# Patient Record
Sex: Female | Born: 1940 | Race: White | Hispanic: No | Marital: Married | Smoking: Never smoker
Health system: Southern US, Community
[De-identification: ages and names within clinical notes are randomized; demographics above are authoritative.]

## PROBLEM LIST (undated history)

## (undated) DIAGNOSIS — R112 Nausea with vomiting, unspecified: Secondary | ICD-10-CM

## (undated) DIAGNOSIS — I4891 Unspecified atrial fibrillation: Secondary | ICD-10-CM

## (undated) DIAGNOSIS — T4145XA Adverse effect of unspecified anesthetic, initial encounter: Secondary | ICD-10-CM

## (undated) DIAGNOSIS — C801 Malignant (primary) neoplasm, unspecified: Secondary | ICD-10-CM

## (undated) DIAGNOSIS — I10 Essential (primary) hypertension: Secondary | ICD-10-CM

## (undated) DIAGNOSIS — Z86718 Personal history of other venous thrombosis and embolism: Secondary | ICD-10-CM

## (undated) DIAGNOSIS — T8859XA Other complications of anesthesia, initial encounter: Secondary | ICD-10-CM

## (undated) DIAGNOSIS — I890 Lymphedema, not elsewhere classified: Secondary | ICD-10-CM

## (undated) DIAGNOSIS — Z9889 Other specified postprocedural states: Secondary | ICD-10-CM

## (undated) HISTORY — PX: MELANOMA EXCISION: SHX5266

## (undated) HISTORY — DX: Unspecified atrial fibrillation: I48.91

## (undated) HISTORY — PX: TUBAL LIGATION: SHX77

---

## 1990-11-28 DIAGNOSIS — C4372 Malignant melanoma of left lower limb, including hip: Secondary | ICD-10-CM | POA: Insufficient documentation

## 1995-11-28 DIAGNOSIS — C774 Secondary and unspecified malignant neoplasm of inguinal and lower limb lymph nodes: Secondary | ICD-10-CM | POA: Insufficient documentation

## 1997-01-05 DIAGNOSIS — Z86718 Personal history of other venous thrombosis and embolism: Secondary | ICD-10-CM

## 1997-01-05 HISTORY — DX: Personal history of other venous thrombosis and embolism: Z86.718

## 2010-02-03 ENCOUNTER — Ambulatory Visit (HOSPITAL_COMMUNITY)
Admission: RE | Admit: 2010-02-03 | Discharge: 2010-02-03 | Payer: Self-pay | Source: Home / Self Care | Attending: Oncology | Admitting: Oncology

## 2010-02-03 LAB — GLUCOSE, CAPILLARY: Glucose-Capillary: 98 mg/dL (ref 70–99)

## 2011-01-21 DIAGNOSIS — J209 Acute bronchitis, unspecified: Secondary | ICD-10-CM | POA: Diagnosis not present

## 2011-02-06 DIAGNOSIS — Z09 Encounter for follow-up examination after completed treatment for conditions other than malignant neoplasm: Secondary | ICD-10-CM | POA: Diagnosis not present

## 2011-02-06 DIAGNOSIS — Z85820 Personal history of malignant melanoma of skin: Secondary | ICD-10-CM | POA: Diagnosis not present

## 2011-02-06 DIAGNOSIS — Z87898 Personal history of other specified conditions: Secondary | ICD-10-CM | POA: Diagnosis not present

## 2011-03-16 DIAGNOSIS — L820 Inflamed seborrheic keratosis: Secondary | ICD-10-CM | POA: Diagnosis not present

## 2011-03-16 DIAGNOSIS — Z85820 Personal history of malignant melanoma of skin: Secondary | ICD-10-CM | POA: Diagnosis not present

## 2011-05-19 DIAGNOSIS — I10 Essential (primary) hypertension: Secondary | ICD-10-CM | POA: Diagnosis not present

## 2011-05-19 DIAGNOSIS — E782 Mixed hyperlipidemia: Secondary | ICD-10-CM | POA: Diagnosis not present

## 2011-09-16 DIAGNOSIS — I890 Lymphedema, not elsewhere classified: Secondary | ICD-10-CM | POA: Diagnosis not present

## 2011-09-16 DIAGNOSIS — R300 Dysuria: Secondary | ICD-10-CM | POA: Diagnosis not present

## 2011-09-16 DIAGNOSIS — Z85820 Personal history of malignant melanoma of skin: Secondary | ICD-10-CM | POA: Diagnosis not present

## 2011-10-22 DIAGNOSIS — R109 Unspecified abdominal pain: Secondary | ICD-10-CM | POA: Diagnosis not present

## 2011-10-30 DIAGNOSIS — E782 Mixed hyperlipidemia: Secondary | ICD-10-CM | POA: Diagnosis not present

## 2011-10-30 DIAGNOSIS — I10 Essential (primary) hypertension: Secondary | ICD-10-CM | POA: Diagnosis not present

## 2011-11-02 DIAGNOSIS — N949 Unspecified condition associated with female genital organs and menstrual cycle: Secondary | ICD-10-CM | POA: Diagnosis not present

## 2011-11-02 DIAGNOSIS — R109 Unspecified abdominal pain: Secondary | ICD-10-CM | POA: Diagnosis not present

## 2011-12-04 DIAGNOSIS — Z23 Encounter for immunization: Secondary | ICD-10-CM | POA: Diagnosis not present

## 2012-01-24 IMAGING — PT NM PET IMAGE RESTAGE (PS) WHOLE BODY
6 series · 25 of 25 positions shown · non-contrast
Comparison: None

CLINICAL DATA: Subsequent treatment strategy for melanoma.

NUCLEAR MEDICINE WHOLE BODY PET CT
TECHNIQUE: 17.3 mCi F-18 FDG was injected intravenously via the
left antecubital.  Full-ring PET imaging was performed from the
skull vertex through the lower extremities 60 minutes after
injection.  CT data was obtained and used for attenuation
correction and anatomic localization only.  (This was not acquired
as a diagnostic CT examination.)
Fasting Blood Glucose:  98

[Series 1: pet ac · axial · 3.3mm · 4.69mm/px · z∈[-879,-9]mm · 5 of 267 slices shown]
[im 1/267]
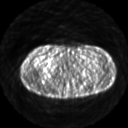
[im 67/267]
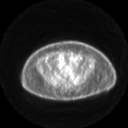
[im 134/267]
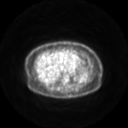
[im 200/267]
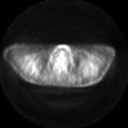
[im 267/267]
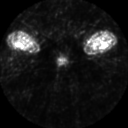

[Series 2: ct images · axial · 3.8mm · 0.98mm/px · z∈[-879,-10]mm · 6 of 266 slices shown]
[im 1/266  soft-tissue]
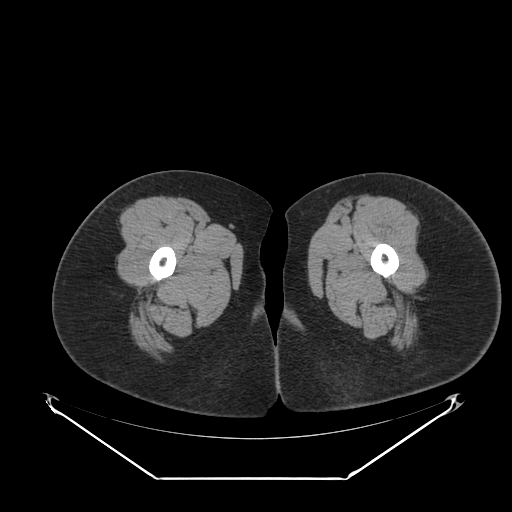
[im 54/266  soft-tissue]
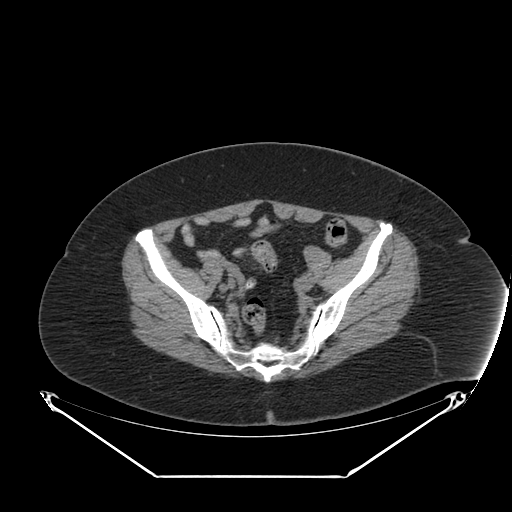
[im 107/266  soft-tissue]
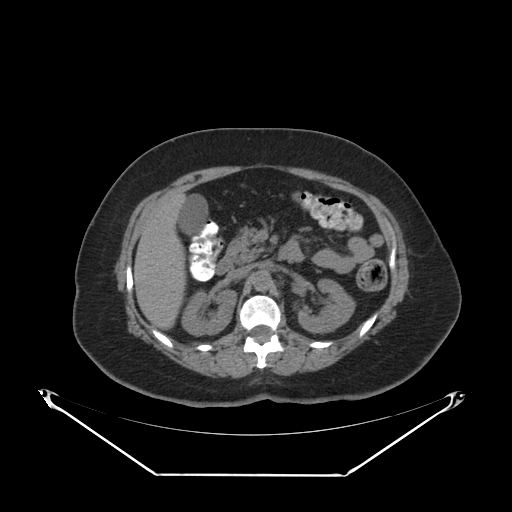
[im 160/266  soft-tissue]
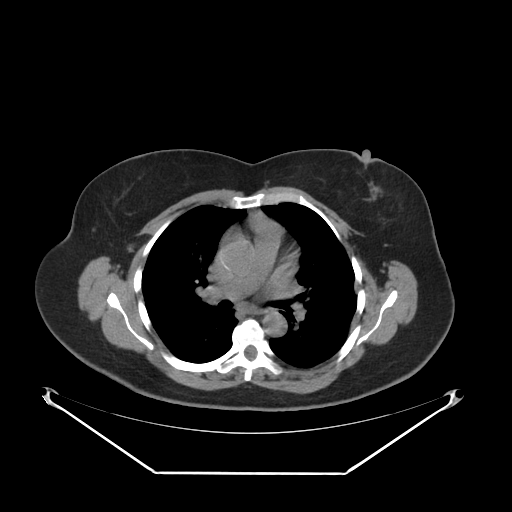
[im 213/266  soft-tissue]
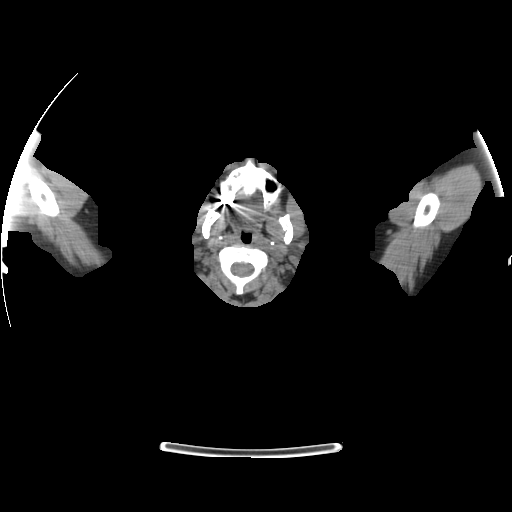
[im 266/266  soft-tissue]
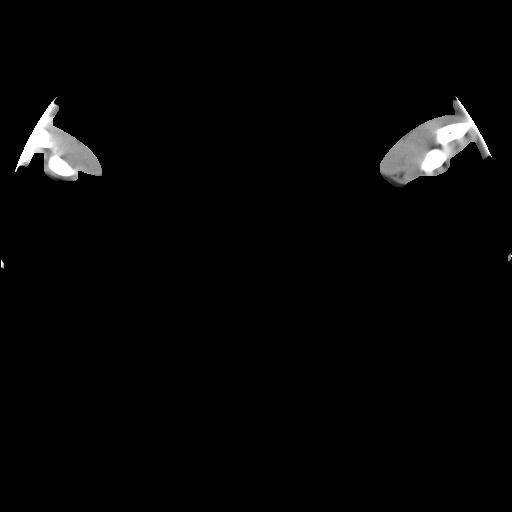

[Series 2: pet nac · axial · 3.3mm · 4.69mm/px · z∈[-879,-9]mm · 6 of 267 slices shown]
[im 1/267]
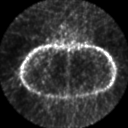
[im 54/267]
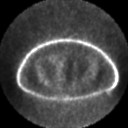
[im 107/267]
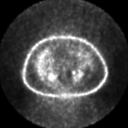
[im 160/267]
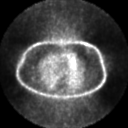
[im 213/267]
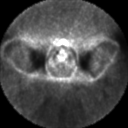
[im 267/267]
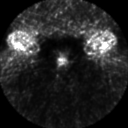

[Series 123: mip · coronal · 3.3mm · 4.69mm/px · 1 of 30 slices shown]
[im 1/30]
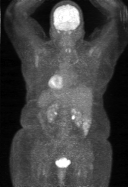

[Series 151: reformatted · axial · 3.3mm · 3.91mm/px · z∈[-879,-9]mm · 6 of 265 slices shown (1 of 2)]
[im 1/265]
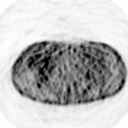
[im 53/265]
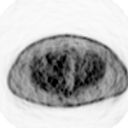
[im 106/265]
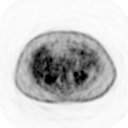
[im 159/265]
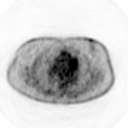
[im 212/265]
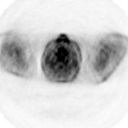
[im 265/265]
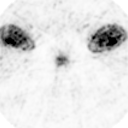

[Series 153: reformatted · coronal · 4.7mm · 6.98mm/px · 1 of 63 slices shown (2 of 2)]
[im 1/63]
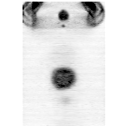

[25 of 25 positions shown; findings below may reference images not displayed]

FINDINGS: No areas of abnormal FDG uptake are identified.  The CT
examination shows no significant abnormalities.  There is a small
calcified splenic artery aneurysm noted.  The uterus and ovaries
appear normal.  No adenopathy.
IMPRESSION: Negative PET CT for metabolically active melanoma.

## 2013-12-21 DIAGNOSIS — M9903 Segmental and somatic dysfunction of lumbar region: Secondary | ICD-10-CM | POA: Diagnosis not present

## 2013-12-21 DIAGNOSIS — M5416 Radiculopathy, lumbar region: Secondary | ICD-10-CM | POA: Diagnosis not present

## 2013-12-21 DIAGNOSIS — M9902 Segmental and somatic dysfunction of thoracic region: Secondary | ICD-10-CM | POA: Diagnosis not present

## 2013-12-21 DIAGNOSIS — M9905 Segmental and somatic dysfunction of pelvic region: Secondary | ICD-10-CM | POA: Diagnosis not present

## 2013-12-26 DIAGNOSIS — M9905 Segmental and somatic dysfunction of pelvic region: Secondary | ICD-10-CM | POA: Diagnosis not present

## 2013-12-26 DIAGNOSIS — M9902 Segmental and somatic dysfunction of thoracic region: Secondary | ICD-10-CM | POA: Diagnosis not present

## 2013-12-26 DIAGNOSIS — M5416 Radiculopathy, lumbar region: Secondary | ICD-10-CM | POA: Diagnosis not present

## 2013-12-26 DIAGNOSIS — M9903 Segmental and somatic dysfunction of lumbar region: Secondary | ICD-10-CM | POA: Diagnosis not present

## 2014-03-19 ENCOUNTER — Encounter (HOSPITAL_COMMUNITY)
Admission: RE | Admit: 2014-03-19 | Discharge: 2014-03-19 | Disposition: A | Discharge status: Home / Self Care | Payer: Medicare Other | Source: Ambulatory Visit | Attending: Obstetrics and Gynecology | Admitting: Obstetrics and Gynecology

## 2014-03-19 ENCOUNTER — Encounter (HOSPITAL_COMMUNITY): Payer: Self-pay

## 2014-03-19 ENCOUNTER — Other Ambulatory Visit: Payer: Self-pay

## 2014-03-19 DIAGNOSIS — Z01810 Encounter for preprocedural cardiovascular examination: Secondary | ICD-10-CM | POA: Diagnosis present

## 2014-03-19 DIAGNOSIS — Z01812 Encounter for preprocedural laboratory examination: Secondary | ICD-10-CM | POA: Insufficient documentation

## 2014-03-19 HISTORY — DX: Lymphedema, not elsewhere classified: I89.0

## 2014-03-19 HISTORY — DX: Personal history of other venous thrombosis and embolism: Z86.718

## 2014-03-19 HISTORY — DX: Other complications of anesthesia, initial encounter: T88.59XA

## 2014-03-19 HISTORY — DX: Adverse effect of unspecified anesthetic, initial encounter: T41.45XA

## 2014-03-19 HISTORY — DX: Essential (primary) hypertension: I10

## 2014-03-19 HISTORY — DX: Malignant (primary) neoplasm, unspecified: C80.1

## 2014-03-19 LAB — CBC
HEMATOCRIT: 41.8 % (ref 36.0–46.0)
HEMOGLOBIN: 14.1 g/dL (ref 12.0–15.0)
MCH: 30.4 pg (ref 26.0–34.0)
MCHC: 33.7 g/dL (ref 30.0–36.0)
MCV: 90.1 fL (ref 78.0–100.0)
Platelets: 275 10*3/uL (ref 150–400)
RBC: 4.64 MIL/uL (ref 3.87–5.11)
RDW: 15.1 % (ref 11.5–15.5)
WBC: 9 10*3/uL (ref 4.0–10.5)

## 2014-03-19 LAB — BASIC METABOLIC PANEL
ANION GAP: 8 (ref 5–15)
BUN: 21 mg/dL (ref 6–23)
CALCIUM: 9.8 mg/dL (ref 8.4–10.5)
CO2: 29 mmol/L (ref 19–32)
CREATININE: 0.79 mg/dL (ref 0.50–1.10)
Chloride: 106 mmol/L (ref 96–112)
GFR calc Af Amer: >90 mL/min (ref >90)
GFR calc non Af Amer: 80 mL/min — ABNORMAL LOW (ref >90)
GLUCOSE: 80 mg/dL (ref 70–99)
Potassium: 4.1 mmol/L (ref 3.5–5.1)
SODIUM: 143 mmol/L (ref 135–145)

## 2014-03-19 NOTE — Anesthesia Preprocedure Evaluation (Addendum)
Anesthesia Evaluation  Patient identified by MRN, date of birth, ID band Patient awake    Reviewed: Allergy & Precautions, H&P , NPO status , Patient's Chart, lab work & pertinent test results  History of Anesthesia Complications (+) history of anesthetic complications (Reports being "slow to wake up" and requiring a "wake up shot" and being held overnight after last anesthetic, records being requested)  Airway Mallampati: II       Dental no notable dental hx.    Pulmonary neg pulmonary ROS,  breath sounds clear to auscultation  Pulmonary exam normal       Cardiovascular Exercise Tolerance: Good hypertension, Pt. on medications Rhythm:regular Rate:Normal     Neuro/Psych negative neurological ROS  negative psych ROS   GI/Hepatic negative GI ROS, Neg liver ROS,   Endo/Other  obesity  Renal/GU negative Renal ROS  negative genitourinary   Musculoskeletal negative musculoskeletal ROS (+)   Abdominal   Peds negative pediatric ROS (+)  Hematology negative hematology ROS (+)   Anesthesia Other Findings   Reproductive/Obstetrics negative OB ROS                            Anesthesia Physical Anesthesia Plan  ASA: II  Anesthesia Plan: Spinal   Post-op Pain Management:    Induction:   Airway Management Planned:   Additional Equipment:   Intra-op Plan:   Post-operative Plan:   Informed Consent: I have reviewed the patients History and Physical, chart, labs and discussed the procedure including the risks, benefits and alternatives for the proposed anesthesia with the patient or authorized representative who has indicated his/her understanding and acceptance.     Plan Discussed with: Anesthesiologist, CRNA and Surgeon  Anesthesia Plan Comments: (Patient seen 3/14 at patient request because she is anxious about anesthesia. Reports being very slow to wake up after last surgery (knee  surgery in [place]) and then she had to get a "wake up shot" and remain in the hospital overnight. I have requested that this anesthetic record be sent for and will need to be reviewed. She reports that even after colonoscopies she is slow to get back to her baseline. I discussed with the patient the wide variety of causes of delayed emergence and discussed that we can give just the minimum amount of medication required for her surgery. Would suggest avoiding benzodiazepines. )       Anesthesia Quick Evaluation

## 2014-03-27 ENCOUNTER — Ambulatory Visit (HOSPITAL_COMMUNITY): Payer: Medicare Other | Admitting: Anesthesiology

## 2014-03-27 ENCOUNTER — Ambulatory Visit (HOSPITAL_COMMUNITY)
Admission: RE | Admit: 2014-03-27 | Discharge: 2014-03-27 | Disposition: A | Discharge status: Home / Self Care | Payer: Medicare Other | Source: Ambulatory Visit | Attending: Obstetrics and Gynecology | Admitting: Obstetrics and Gynecology

## 2014-03-27 ENCOUNTER — Encounter (HOSPITAL_COMMUNITY): Payer: Self-pay | Admitting: Emergency Medicine

## 2014-03-27 ENCOUNTER — Encounter (HOSPITAL_COMMUNITY)
Admission: RE | Disposition: A | Discharge status: Home / Self Care | Payer: Self-pay | Source: Ambulatory Visit | Attending: Obstetrics and Gynecology

## 2014-03-27 DIAGNOSIS — Z9851 Tubal ligation status: Secondary | ICD-10-CM | POA: Diagnosis not present

## 2014-03-27 DIAGNOSIS — N950 Postmenopausal bleeding: Secondary | ICD-10-CM | POA: Insufficient documentation

## 2014-03-27 DIAGNOSIS — Z7982 Long term (current) use of aspirin: Secondary | ICD-10-CM | POA: Diagnosis not present

## 2014-03-27 DIAGNOSIS — N904 Leukoplakia of vulva: Secondary | ICD-10-CM | POA: Insufficient documentation

## 2014-03-27 DIAGNOSIS — N841 Polyp of cervix uteri: Secondary | ICD-10-CM | POA: Insufficient documentation

## 2014-03-27 DIAGNOSIS — I10 Essential (primary) hypertension: Secondary | ICD-10-CM | POA: Insufficient documentation

## 2014-03-27 HISTORY — DX: Nausea with vomiting, unspecified: R11.2

## 2014-03-27 HISTORY — DX: Nausea with vomiting, unspecified: Z98.890

## 2014-03-27 HISTORY — PX: VULVA /PERINEUM BIOPSY: SHX319

## 2014-03-27 HISTORY — PX: HYSTEROSCOPY W/D&C: SHX1775

## 2014-03-27 SURGERY — DILATATION AND CURETTAGE /HYSTEROSCOPY
Anesthesia: Spinal | Site: Vagina

## 2014-03-27 MED ORDER — LIDOCAINE HCL 1 % IJ SOLN
INTRAMUSCULAR | Status: AC
  Filled 2014-03-27: qty 20

## 2014-03-27 MED ORDER — LIDOCAINE HCL (CARDIAC) 20 MG/ML IV SOLN
INTRAVENOUS | Status: AC
  Filled 2014-03-27: qty 5

## 2014-03-27 MED ORDER — IBUPROFEN 600 MG PO TABS: 600.0000 mg | ORAL_TABLET | Freq: Four times a day (QID) | ORAL | Status: DC | PRN

## 2014-03-27 MED ORDER — LACTATED RINGERS IV SOLN
INTRAVENOUS | Status: DC
  Administered 2014-03-27: 12:00:00 via INTRAVENOUS

## 2014-03-27 MED ORDER — PROPOFOL 10 MG/ML IV BOLUS
INTRAVENOUS | Status: AC
  Filled 2014-03-27: qty 20

## 2014-03-27 MED ORDER — FENTANYL CITRATE 0.05 MG/ML IJ SOLN
INTRAMUSCULAR | Status: AC
  Filled 2014-03-27: qty 2

## 2014-03-27 MED ORDER — BUPIVACAINE IN DEXTROSE 0.75-8.25 % IT SOLN
INTRATHECAL | Status: DC | PRN
  Administered 2014-03-27: 1.2 mL via INTRATHECAL

## 2014-03-27 MED ORDER — SODIUM CHLORIDE 0.9 % IR SOLN
Status: DC | PRN
  Administered 2014-03-27: 3000 mL

## 2014-03-27 MED ORDER — ONDANSETRON HCL 4 MG/2ML IJ SOLN
INTRAMUSCULAR | Status: AC
  Filled 2014-03-27: qty 2

## 2014-03-27 MED ORDER — DEXAMETHASONE SODIUM PHOSPHATE 4 MG/ML IJ SOLN
INTRAMUSCULAR | Status: AC
  Filled 2014-03-27: qty 1

## 2014-03-27 MED ORDER — LIDOCAINE HCL 1 % IJ SOLN
INTRAMUSCULAR | Status: DC | PRN
  Administered 2014-03-27: 6 mL

## 2014-03-27 SURGICAL SUPPLY — 28 items
ABLATOR ENDOMETRIAL BIPOLAR (ABLATOR) IMPLANT
APPLICATOR COTTON TIP 6IN STRL (MISCELLANEOUS) IMPLANT
BLADE 15 SAFETY STRL DISP (BLADE) ×3 IMPLANT
CANISTER SUCT 3000ML (MISCELLANEOUS) ×3 IMPLANT
CATH ROBINSON RED A/P 16FR (CATHETERS) ×3 IMPLANT
CATH THERMACHOICE III (CATHETERS) IMPLANT
CLOTH BEACON ORANGE TIMEOUT ST (SAFETY) ×3 IMPLANT
CONTAINER PREFILL 10% NBF 60ML (FORM) ×9 IMPLANT
ELECT REM PT RETURN 9FT ADLT (ELECTROSURGICAL) ×3
ELECTRODE REM PT RTRN 9FT ADLT (ELECTROSURGICAL) ×1 IMPLANT
GLOVE BIO SURGEON STRL SZ 6.5 (GLOVE) ×2 IMPLANT
GLOVE BIO SURGEONS STRL SZ 6.5 (GLOVE) ×1
GLOVE BIOGEL PI IND STRL 7.0 (GLOVE) ×1 IMPLANT
GLOVE BIOGEL PI INDICATOR 7.0 (GLOVE) ×2
GOWN STRL REUS W/TWL LRG LVL3 (GOWN DISPOSABLE) ×6 IMPLANT
LOOP ANGLED CUTTING 22FR (CUTTING LOOP) IMPLANT
NS IRRIG 1000ML POUR BTL (IV SOLUTION) ×3 IMPLANT
PACK VAGINAL MINOR WOMEN LF (CUSTOM PROCEDURE TRAY) ×3 IMPLANT
PAD OB MATERNITY 4.3X12.25 (PERSONAL CARE ITEMS) ×3 IMPLANT
PENCIL BUTTON HOLSTER BLD 10FT (ELECTRODE) ×3 IMPLANT
SCOPETTES 8  STERILE (MISCELLANEOUS)
SCOPETTES 8 STERILE (MISCELLANEOUS) IMPLANT
SUT VIC AB 3-0 SH 27 (SUTURE)
SUT VIC AB 3-0 SH 27X BRD (SUTURE) IMPLANT
TOWEL OR 17X24 6PK STRL BLUE (TOWEL DISPOSABLE) ×6 IMPLANT
TUBING AQUILEX INFLOW (TUBING) ×3 IMPLANT
TUBING AQUILEX OUTFLOW (TUBING) ×3 IMPLANT
WATER STERILE IRR 1000ML POUR (IV SOLUTION) ×3 IMPLANT

## 2014-03-27 NOTE — Anesthesia Procedure Notes (Signed)
Spinal Patient location during procedure: OR Start time: 03/27/2014 1:05 PM Staffing Anesthesiologist: Rudean Curt Performed by: anesthesiologist  Preanesthetic Checklist Completed: patient identified, site marked, surgical consent, pre-op evaluation, timeout performed, IV checked, risks and benefits discussed and monitors and equipment checked Spinal Block Patient position: sitting Prep: DuraPrep Patient monitoring: heart rate, cardiac monitor, continuous pulse ox and blood pressure Approach: midline Location: L3-4 Injection technique: single-shot Needle Needle type: Sprotte  Needle gauge: 24 G Needle length: 9 cm Assessment Sensory level: T4 Additional Notes Patient identified.  Risk benefits discussed including failed block, incomplete pain control, headache, nerve damage, paralysis, blood pressure changes, nausea, vomiting, reactions to medication both toxic or allergic, and postpartum back pain.  Patient expressed understanding and wished to proceed.  All questions were answered.  Sterile technique used throughout procedure.  CSF was clear.  No parasthesia or other complications.  Please see nursing notes for vital signs.

## 2014-03-27 NOTE — H&P (Signed)
Latasha Wells is an 74 y.o. female with PMB and persistant vulvar itching presents for surgical evaluation and treatment.    Past Medical History  Diagnosis Date  . Hypertension   . Cancer     melanoma of lymph system  . Lymphedema of leg     left leg  . H/O blood clots 1999    left leg behind knee  . Complication of anesthesia     "hard to wake up", reaction to med ('wake up shot")    Past Surgical History  Procedure Laterality Date  . Tubal ligation    . Melanoma excision      x 2    No family history on file.  Social History:  reports that she has never smoked. She does not have any smokeless tobacco history on file. She reports that she does not drink alcohol or use illicit drugs.  Allergies:  Allergies  Allergen Reactions  . Codeine Nausea And Vomiting  . Hydrocodone Other (See Comments)    Seeing double  . Sulfa Antibiotics Other (See Comments)    Family history, prefers not to take    No prescriptions prior to admission    ROS  AF, VSS  Physical Exam  Gen - NAD CV - RRR Lungs  Clear Abd - soft, NT PV - uterus mobile, NT.  No adnexal masses No vulvar masses, ulcerations or pigmented lesions.  mild hypopigmentation of labia and clitoral hood  Assessment/Plan: PMB, vulvar itching Hysteroscopy, D&C, vulvar biopsy  Jenean Escandon 03/27/2014, 8:36 AM

## 2014-03-27 NOTE — Discharge Instructions (Signed)
DISCHARGE INSTRUCTIONS: D&C  °The following instructions have been prepared to help you care for yourself upon your return home. °  °Personal hygiene: °• Use sanitary pads for vaginal drainage, not tampons. °• Shower the day after your procedure. °• NO tub baths, pools or Jacuzzis for 2-3 weeks. °• Wipe front to back after using the bathroom. ° °Activity and limitations: °• Do NOT drive or operate any equipment for 24 hours. The effects of anesthesia are still present and drowsiness may result. °• Do NOT rest in bed all day. °• Walking is encouraged. °• Walk up and down stairs slowly. °• You may resume your normal activity in one to two days or as indicated by your physician. ° °Sexual activity: NO intercourse for at least 2 weeks after the procedure, or as indicated by your physician. ° °Diet: Eat a light meal as desired this evening. You may resume your usual diet tomorrow. ° °Return to work: You may resume your work activities in one to two days or as indicated by your doctor. ° °What to expect after your surgery: Expect to have vaginal bleeding/discharge for 2-3 days and spotting for up to 10 days. It is not unusual to have soreness for up to 1-2 weeks. You may have a slight burning sensation when you urinate for the first day. Mild cramps may continue for a couple of days. You may have a regular period in 2-6 weeks. ° °Call your doctor for any of the following: °• Excessive vaginal bleeding, saturating and changing one pad every hour. °• Inability to urinate 6 hours after discharge from hospital. °• Pain not relieved by pain medication. °• Fever of 100.4° F or greater. °• Unusual vaginal discharge or odor. ° ° Call for an appointment:  ° ° °Patient’s signature: ______________________ ° °Nurse’s signature ________________________ ° °Support person's signature_______________________ ° ° ° °

## 2014-03-27 NOTE — Anesthesia Postprocedure Evaluation (Signed)
Anesthesia Post Note  Patient: Latasha Wells  Procedure(s) Performed: Procedure(s) (LRB): DILATATION AND CURETTAGE /HYSTEROSCOPY, POLYPECTOMY (N/A) LEFT VULVAR BIOPSY (N/A)  Anesthesia type: Spinal  Patient location: PACU  Post pain: Pain level controlled  Post assessment: Post-op Vital signs reviewed  Last Vitals: There were no vitals filed for this visit.  Post vital signs: Reviewed  Level of consciousness: awake  Complications: No apparent anesthesia complications

## 2014-03-27 NOTE — Transfer of Care (Signed)
Immediate Anesthesia Transfer of Care Note  Patient: Latasha Wells  Procedure(s) Performed: Procedure(s): DILATATION AND CURETTAGE /HYSTEROSCOPY (N/A) VULVAR BIOPSY (N/A)  Patient Location: PACU  Anesthesia Type:Spinal  Level of Consciousness: awake, alert , oriented and patient cooperative  Airway & Oxygen Therapy: Patient Spontanous Breathing  Post-op Assessment: Report given to RN and Post -op Vital signs reviewed and stable  Post vital signs: Reviewed and stable  Last Vitals: There were no vitals filed for this visit.  Complications: No apparent anesthesia complications

## 2014-03-28 NOTE — Op Note (Signed)
NAME:  Latasha Wells, SOLEY NO.:  [id number]  MEDICAL RECORD NO.:  [id number]  LOCATION:  [facility]                          FACILITY:  [facility]  PHYSICIAN:  Marylynn Pearson, MD    DATE OF BIRTH:  08-20-1940  DATE OF PROCEDURE:  03/27/2014 DATE OF DISCHARGE:  03/27/2014                              OPERATIVE REPORT   PREOPERATIVE DIAGNOSES: 1. Postmenopausal bleeding. 2. Persistent vulvar itching.  PROCEDURES: 1. Paracervical block. 2. Hysteroscopy D and C. 3. Polypectomy. 4. Vulvar biopsy.  SURGEON:  Marylynn Pearson, MD  ANESTHESIA:  Spinal.  FLUID DEFICIT:  50 mL.  ESTIMATED BLOOD LOSS:  Minimal.  COMPLICATIONS:  None.  CONDITION:  Stable to recovery room.  DESCRIPTION OF PROCEDURE:  The patient was taken to the operating room. After informed consent was obtained, she was given spinal anesthesia. Placed in dorsal lithotomy position with Allen stirrups.  Prepped and draped in sterile fashion.  In-and-out catheter was used to drain her bladder for an unmeasured amount of clear urine.  Bivalve speculum was placed in the vagina and 1% lidocaine was injected at the 12 o'clock position of the cervix.  A single-tooth tenaculum was attached and a paracervical block was performed using 8 mL of 1% lidocaine.  The cervix was then serially dilated using Pratt dilators and a hysteroscope was inserted.  She was noted to have what appeared to be a polyp at the internal cervical os.  No other abnormalities were noted throughout the endometrial cavity.  Hysteroscope was removed and polyp forceps were introduced.  Polyp was removed without difficulty, placed on Telfa, and passed off to be sent to Pathology.  A gentle curetting was then performed throughout the cavity and placed on Telfa.  All instruments were then removed from the vagina.  Lidocaine 1% was injected at the site of our vulvar biopsy.  A 5 mm punch was used.  Tissue was excised using Metzenbaum scissors, placed  on Telfa, and passed off to be sent to Pathology.  Hemostasis was achieved using silver nitrate.  She tolerated the procedure well.  Sponge, lap, needle, and instrument counts were correct x2 at the end of the procedure.  She was taken to the recovery room in stable condition.     Marylynn Pearson, MD     GA/MEDQ  D:  03/27/2014  T:  03/28/2014  Job:  [id number]

## 2014-03-30 ENCOUNTER — Encounter (HOSPITAL_COMMUNITY): Payer: Self-pay | Admitting: Obstetrics and Gynecology

## 2014-12-07 DIAGNOSIS — Z85820 Personal history of malignant melanoma of skin: Secondary | ICD-10-CM | POA: Diagnosis not present

## 2016-01-13 DIAGNOSIS — I10 Essential (primary) hypertension: Secondary | ICD-10-CM | POA: Diagnosis not present

## 2016-01-28 DIAGNOSIS — R21 Rash and other nonspecific skin eruption: Secondary | ICD-10-CM | POA: Diagnosis not present

## 2016-01-29 DIAGNOSIS — Z85820 Personal history of malignant melanoma of skin: Secondary | ICD-10-CM | POA: Diagnosis not present

## 2016-01-29 DIAGNOSIS — C4372 Malignant melanoma of left lower limb, including hip: Secondary | ICD-10-CM | POA: Diagnosis not present

## 2016-01-29 DIAGNOSIS — I890 Lymphedema, not elsewhere classified: Secondary | ICD-10-CM | POA: Diagnosis not present

## 2016-01-29 DIAGNOSIS — B372 Candidiasis of skin and nail: Secondary | ICD-10-CM | POA: Diagnosis not present

## 2016-01-29 DIAGNOSIS — C774 Secondary and unspecified malignant neoplasm of inguinal and lower limb lymph nodes: Secondary | ICD-10-CM | POA: Diagnosis not present

## 2016-02-25 DIAGNOSIS — Z1231 Encounter for screening mammogram for malignant neoplasm of breast: Secondary | ICD-10-CM | POA: Diagnosis not present

## 2016-04-27 DIAGNOSIS — E669 Obesity, unspecified: Secondary | ICD-10-CM | POA: Diagnosis not present

## 2016-04-27 DIAGNOSIS — I10 Essential (primary) hypertension: Secondary | ICD-10-CM | POA: Diagnosis not present

## 2016-04-27 DIAGNOSIS — Z13820 Encounter for screening for osteoporosis: Secondary | ICD-10-CM | POA: Diagnosis not present

## 2016-04-27 DIAGNOSIS — Z6838 Body mass index (BMI) 38.0-38.9, adult: Secondary | ICD-10-CM | POA: Diagnosis not present

## 2016-04-27 DIAGNOSIS — Z0000 Encounter for general adult medical examination without abnormal findings: Secondary | ICD-10-CM | POA: Diagnosis not present

## 2016-05-04 DIAGNOSIS — J029 Acute pharyngitis, unspecified: Secondary | ICD-10-CM | POA: Diagnosis not present

## 2016-05-12 DIAGNOSIS — J209 Acute bronchitis, unspecified: Secondary | ICD-10-CM | POA: Diagnosis not present

## 2016-07-13 DIAGNOSIS — Z08 Encounter for follow-up examination after completed treatment for malignant neoplasm: Secondary | ICD-10-CM | POA: Diagnosis not present

## 2016-07-13 DIAGNOSIS — Z85820 Personal history of malignant melanoma of skin: Secondary | ICD-10-CM | POA: Diagnosis not present

## 2016-07-27 DIAGNOSIS — I10 Essential (primary) hypertension: Secondary | ICD-10-CM | POA: Diagnosis not present

## 2016-08-04 DIAGNOSIS — Z0001 Encounter for general adult medical examination with abnormal findings: Secondary | ICD-10-CM | POA: Diagnosis not present

## 2016-08-04 DIAGNOSIS — Z85820 Personal history of malignant melanoma of skin: Secondary | ICD-10-CM | POA: Diagnosis not present

## 2016-12-03 DIAGNOSIS — I10 Essential (primary) hypertension: Secondary | ICD-10-CM | POA: Diagnosis not present

## 2016-12-03 DIAGNOSIS — E669 Obesity, unspecified: Secondary | ICD-10-CM | POA: Diagnosis not present

## 2016-12-03 DIAGNOSIS — Z6837 Body mass index (BMI) 37.0-37.9, adult: Secondary | ICD-10-CM | POA: Diagnosis not present

## 2016-12-07 DIAGNOSIS — J209 Acute bronchitis, unspecified: Secondary | ICD-10-CM | POA: Diagnosis not present

## 2017-01-17 DIAGNOSIS — R112 Nausea with vomiting, unspecified: Secondary | ICD-10-CM | POA: Diagnosis not present

## 2017-01-17 DIAGNOSIS — N3000 Acute cystitis without hematuria: Secondary | ICD-10-CM | POA: Diagnosis not present

## 2017-01-17 DIAGNOSIS — J069 Acute upper respiratory infection, unspecified: Secondary | ICD-10-CM | POA: Diagnosis not present

## 2017-01-17 DIAGNOSIS — K5900 Constipation, unspecified: Secondary | ICD-10-CM | POA: Diagnosis not present

## 2017-01-17 DIAGNOSIS — J9811 Atelectasis: Secondary | ICD-10-CM | POA: Diagnosis not present

## 2017-01-27 DIAGNOSIS — N398 Other specified disorders of urinary system: Secondary | ICD-10-CM | POA: Diagnosis not present

## 2017-01-27 DIAGNOSIS — J069 Acute upper respiratory infection, unspecified: Secondary | ICD-10-CM | POA: Diagnosis not present

## 2017-02-04 DIAGNOSIS — C4372 Malignant melanoma of left lower limb, including hip: Secondary | ICD-10-CM | POA: Diagnosis not present

## 2017-02-04 DIAGNOSIS — I10 Essential (primary) hypertension: Secondary | ICD-10-CM | POA: Diagnosis not present

## 2017-02-04 DIAGNOSIS — D649 Anemia, unspecified: Secondary | ICD-10-CM | POA: Diagnosis not present

## 2017-02-04 DIAGNOSIS — R918 Other nonspecific abnormal finding of lung field: Secondary | ICD-10-CM | POA: Diagnosis not present

## 2017-02-04 DIAGNOSIS — Z85820 Personal history of malignant melanoma of skin: Secondary | ICD-10-CM | POA: Diagnosis not present

## 2017-02-04 DIAGNOSIS — R001 Bradycardia, unspecified: Secondary | ICD-10-CM | POA: Diagnosis not present

## 2017-02-20 DIAGNOSIS — J209 Acute bronchitis, unspecified: Secondary | ICD-10-CM | POA: Diagnosis not present

## 2017-03-30 DIAGNOSIS — Z1231 Encounter for screening mammogram for malignant neoplasm of breast: Secondary | ICD-10-CM | POA: Diagnosis not present

## 2017-04-02 DIAGNOSIS — L570 Actinic keratosis: Secondary | ICD-10-CM | POA: Diagnosis not present

## 2017-04-02 DIAGNOSIS — D485 Neoplasm of uncertain behavior of skin: Secondary | ICD-10-CM | POA: Diagnosis not present

## 2017-04-02 DIAGNOSIS — Z08 Encounter for follow-up examination after completed treatment for malignant neoplasm: Secondary | ICD-10-CM | POA: Diagnosis not present

## 2017-04-02 DIAGNOSIS — Z85820 Personal history of malignant melanoma of skin: Secondary | ICD-10-CM | POA: Diagnosis not present

## 2017-04-02 DIAGNOSIS — L304 Erythema intertrigo: Secondary | ICD-10-CM | POA: Diagnosis not present

## 2017-04-06 DIAGNOSIS — S9031XA Contusion of right foot, initial encounter: Secondary | ICD-10-CM | POA: Diagnosis not present

## 2017-04-20 DIAGNOSIS — M79605 Pain in left leg: Secondary | ICD-10-CM | POA: Diagnosis not present

## 2017-04-21 DIAGNOSIS — M79605 Pain in left leg: Secondary | ICD-10-CM | POA: Diagnosis not present

## 2017-04-21 DIAGNOSIS — M25562 Pain in left knee: Secondary | ICD-10-CM | POA: Diagnosis not present

## 2017-05-06 DIAGNOSIS — I10 Essential (primary) hypertension: Secondary | ICD-10-CM | POA: Diagnosis not present

## 2017-05-06 DIAGNOSIS — E669 Obesity, unspecified: Secondary | ICD-10-CM | POA: Diagnosis not present

## 2017-05-06 DIAGNOSIS — Z0000 Encounter for general adult medical examination without abnormal findings: Secondary | ICD-10-CM | POA: Diagnosis not present

## 2017-05-06 DIAGNOSIS — Z6837 Body mass index (BMI) 37.0-37.9, adult: Secondary | ICD-10-CM | POA: Diagnosis not present

## 2017-05-14 DIAGNOSIS — M1711 Unilateral primary osteoarthritis, right knee: Secondary | ICD-10-CM | POA: Diagnosis not present

## 2017-05-14 DIAGNOSIS — M1712 Unilateral primary osteoarthritis, left knee: Secondary | ICD-10-CM | POA: Diagnosis not present

## 2017-05-14 DIAGNOSIS — M25561 Pain in right knee: Secondary | ICD-10-CM | POA: Diagnosis not present

## 2017-05-14 DIAGNOSIS — M25562 Pain in left knee: Secondary | ICD-10-CM | POA: Diagnosis not present

## 2017-07-09 DIAGNOSIS — M25561 Pain in right knee: Secondary | ICD-10-CM | POA: Insufficient documentation

## 2017-07-09 DIAGNOSIS — M1712 Unilateral primary osteoarthritis, left knee: Secondary | ICD-10-CM | POA: Diagnosis not present

## 2017-07-09 DIAGNOSIS — M1711 Unilateral primary osteoarthritis, right knee: Secondary | ICD-10-CM | POA: Diagnosis not present

## 2017-07-09 DIAGNOSIS — M170 Bilateral primary osteoarthritis of knee: Secondary | ICD-10-CM | POA: Diagnosis not present

## 2017-08-20 DIAGNOSIS — I890 Lymphedema, not elsewhere classified: Secondary | ICD-10-CM | POA: Diagnosis not present

## 2017-08-20 DIAGNOSIS — Z85820 Personal history of malignant melanoma of skin: Secondary | ICD-10-CM | POA: Diagnosis not present

## 2017-11-16 DIAGNOSIS — I10 Essential (primary) hypertension: Secondary | ICD-10-CM | POA: Diagnosis not present

## 2017-11-16 DIAGNOSIS — E669 Obesity, unspecified: Secondary | ICD-10-CM | POA: Diagnosis not present

## 2017-11-16 DIAGNOSIS — Z6838 Body mass index (BMI) 38.0-38.9, adult: Secondary | ICD-10-CM | POA: Diagnosis not present

## 2018-01-07 DIAGNOSIS — M65341 Trigger finger, right ring finger: Secondary | ICD-10-CM | POA: Diagnosis not present

## 2018-02-04 DIAGNOSIS — M65341 Trigger finger, right ring finger: Secondary | ICD-10-CM | POA: Diagnosis not present

## 2018-08-05 ENCOUNTER — Other Ambulatory Visit: Payer: Self-pay

## 2018-08-15 DIAGNOSIS — Z85820 Personal history of malignant melanoma of skin: Secondary | ICD-10-CM | POA: Diagnosis not present

## 2018-08-15 DIAGNOSIS — Z808 Family history of malignant neoplasm of other organs or systems: Secondary | ICD-10-CM | POA: Diagnosis not present

## 2018-08-15 DIAGNOSIS — D485 Neoplasm of uncertain behavior of skin: Secondary | ICD-10-CM | POA: Diagnosis not present

## 2018-08-15 DIAGNOSIS — Z08 Encounter for follow-up examination after completed treatment for malignant neoplasm: Secondary | ICD-10-CM | POA: Diagnosis not present

## 2018-08-15 DIAGNOSIS — Z85828 Personal history of other malignant neoplasm of skin: Secondary | ICD-10-CM | POA: Diagnosis not present

## 2018-08-23 DIAGNOSIS — B079 Viral wart, unspecified: Secondary | ICD-10-CM | POA: Diagnosis not present

## 2018-10-04 DIAGNOSIS — I10 Essential (primary) hypertension: Secondary | ICD-10-CM | POA: Diagnosis not present

## 2018-10-13 DIAGNOSIS — Z23 Encounter for immunization: Secondary | ICD-10-CM | POA: Diagnosis not present

## 2018-10-21 DIAGNOSIS — C4372 Malignant melanoma of left lower limb, including hip: Secondary | ICD-10-CM | POA: Diagnosis not present

## 2018-10-21 DIAGNOSIS — Z85820 Personal history of malignant melanoma of skin: Secondary | ICD-10-CM | POA: Diagnosis not present

## 2018-10-21 DIAGNOSIS — Z1231 Encounter for screening mammogram for malignant neoplasm of breast: Secondary | ICD-10-CM | POA: Diagnosis not present

## 2018-10-26 DIAGNOSIS — Z85820 Personal history of malignant melanoma of skin: Secondary | ICD-10-CM | POA: Diagnosis not present

## 2018-10-26 DIAGNOSIS — E785 Hyperlipidemia, unspecified: Secondary | ICD-10-CM | POA: Diagnosis not present

## 2018-11-10 DIAGNOSIS — Z0000 Encounter for general adult medical examination without abnormal findings: Secondary | ICD-10-CM | POA: Diagnosis not present

## 2018-11-10 DIAGNOSIS — I10 Essential (primary) hypertension: Secondary | ICD-10-CM | POA: Diagnosis not present

## 2018-11-10 DIAGNOSIS — Z6837 Body mass index (BMI) 37.0-37.9, adult: Secondary | ICD-10-CM | POA: Diagnosis not present

## 2018-11-10 DIAGNOSIS — E669 Obesity, unspecified: Secondary | ICD-10-CM | POA: Diagnosis not present

## 2018-11-17 DIAGNOSIS — K828 Other specified diseases of gallbladder: Secondary | ICD-10-CM | POA: Diagnosis not present

## 2018-11-17 DIAGNOSIS — K7689 Other specified diseases of liver: Secondary | ICD-10-CM | POA: Diagnosis not present

## 2018-11-17 DIAGNOSIS — R10811 Right upper quadrant abdominal tenderness: Secondary | ICD-10-CM | POA: Diagnosis not present

## 2019-03-09 DIAGNOSIS — I10 Essential (primary) hypertension: Secondary | ICD-10-CM | POA: Diagnosis not present

## 2019-04-26 DIAGNOSIS — G620 Drug-induced polyneuropathy: Secondary | ICD-10-CM | POA: Diagnosis not present

## 2019-04-26 DIAGNOSIS — Z85820 Personal history of malignant melanoma of skin: Secondary | ICD-10-CM | POA: Diagnosis not present

## 2019-04-26 DIAGNOSIS — C4372 Malignant melanoma of left lower limb, including hip: Secondary | ICD-10-CM | POA: Diagnosis not present

## 2019-08-11 DIAGNOSIS — I10 Essential (primary) hypertension: Secondary | ICD-10-CM | POA: Diagnosis not present

## 2019-08-11 DIAGNOSIS — E669 Obesity, unspecified: Secondary | ICD-10-CM | POA: Diagnosis not present

## 2019-08-11 DIAGNOSIS — Z6838 Body mass index (BMI) 38.0-38.9, adult: Secondary | ICD-10-CM | POA: Diagnosis not present

## 2019-08-21 DIAGNOSIS — D239 Other benign neoplasm of skin, unspecified: Secondary | ICD-10-CM | POA: Diagnosis not present

## 2019-08-21 DIAGNOSIS — Z85820 Personal history of malignant melanoma of skin: Secondary | ICD-10-CM | POA: Diagnosis not present

## 2019-08-21 DIAGNOSIS — D1801 Hemangioma of skin and subcutaneous tissue: Secondary | ICD-10-CM | POA: Diagnosis not present

## 2019-08-21 DIAGNOSIS — L821 Other seborrheic keratosis: Secondary | ICD-10-CM | POA: Diagnosis not present

## 2019-10-05 DIAGNOSIS — L820 Inflamed seborrheic keratosis: Secondary | ICD-10-CM | POA: Diagnosis not present

## 2019-10-05 DIAGNOSIS — D485 Neoplasm of uncertain behavior of skin: Secondary | ICD-10-CM | POA: Diagnosis not present

## 2019-11-16 DIAGNOSIS — Z6838 Body mass index (BMI) 38.0-38.9, adult: Secondary | ICD-10-CM | POA: Diagnosis not present

## 2019-11-16 DIAGNOSIS — Z0000 Encounter for general adult medical examination without abnormal findings: Secondary | ICD-10-CM | POA: Diagnosis not present

## 2019-11-16 DIAGNOSIS — I10 Essential (primary) hypertension: Secondary | ICD-10-CM | POA: Diagnosis not present

## 2019-11-16 DIAGNOSIS — N959 Unspecified menopausal and perimenopausal disorder: Secondary | ICD-10-CM | POA: Diagnosis not present

## 2019-11-21 DIAGNOSIS — Z1231 Encounter for screening mammogram for malignant neoplasm of breast: Secondary | ICD-10-CM | POA: Diagnosis not present

## 2019-11-24 NOTE — Progress Notes (Signed)
[facility]  [address] [place],  [place]  [address] [phone]  Clinic Day:  11/28/2019  Referring physician: Greig Right, MD   This document serves as a record of services personally performed by Hosie Poisson, MD. It was created on their behalf by [facility] E, a trained medical scribe. The creation of this record is based on the scribe's personal observations and the provider's statements to them.   CHIEF COMPLAINT:  CC: History of stage IIA malignant melanoma  Current Treatment:  Surveillance   HISTORY OF PRESENT ILLNESS:  Latasha Wells is a 79 y.o. female with a history of stage IIA malignant melanoma originally diagnosed in 25.  She had a Clark's level II with a Breslow thickness of 7.7 mm, treated with wide excision.  In 1997, she had a recurrence in the left groin and underwent node dissection, followed by 1 year of alpha interferon with Consuello Masse, MD at [facility].  In 1998, when she had just 1 week remaining on the interferon therapy, she developed a recurrence in the left groin again.  She then saw Willadean Carol, MD in [place] and underwent a deep node dissection of the left groin, followed by experimental immune therapy.  She has not had any evidence of recurrence since that time.  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  She sees her dermatologist, Dr. Altamese Cabal, in [facility], regularly.  She states she is up-to-date on screening colonoscopy.  Ultrasound of the abdomen limited right upper quadrant from November 2020 was clear other than suggestion of mild hepatic steatosis.     INTERVAL HISTORY:  Any is here for routine follow up and states that she has been well other than persistent intermittent cough.  This is sometimes productive with white sputum.  Annual bilateral mammogram from November was clear.  Bone density from November 22nd revealed a completely normal  exam.   I have scheduled her for routine screening chest x-ray.  Blood counts and chemistries are unremarkable.  Lipid panel revealed an elevated triglyceride of 154 and an LDL of 113.2, otherwise normal.   Her  appetite is good, and she has gained 4 pounds since her last visit.  She denies fever, chills or other signs of infection.  She denies nausea, vomiting, bowel issues, or abdominal pain.  She denies sore throat, cough, dyspnea, or chest pain.   REVIEW OF SYSTEMS:  Review of Systems  Respiratory: Positive for cough (intermittent but persistent, sometimes productive with white sputum).   All other systems reviewed and are negative.    VITALS:  Blood pressure 139/81, pulse 65, temperature (!) 97.5 F (36.4 C), temperature source Oral, resp. rate 18, height 5\' 1"  (1.549 m), weight 197 lb (89.4 kg), SpO2 97 %.  Wt Readings from Last 3 Encounters:  11/28/19 197 lb (89.4 kg)  03/19/14 193 lb (87.5 kg)    Body mass index is 37.22 kg/m.  Performance status (ECOG): 1 - Symptomatic but completely ambulatory  PHYSICAL EXAM:  Physical Exam Constitutional:      General: She is not in acute distress.    Appearance: Normal appearance. She is normal weight.  HENT:     Head: Normocephalic and atraumatic.  Eyes:     General: No scleral icterus.    Extraocular Movements: Extraocular movements intact.     Conjunctiva/sclera: Conjunctivae normal.     Pupils: Pupils are equal, round, and reactive to light.  Cardiovascular:  Rate and Rhythm: Normal rate and regular rhythm.     Pulses: Normal pulses.     Heart sounds: Normal heart sounds. No murmur heard.  No friction rub. No gallop.   Pulmonary:     Effort: Pulmonary effort is normal. No respiratory distress.     Breath sounds: Normal breath sounds.  Abdominal:     General: Bowel sounds are normal. There is no distension.     Palpations: Abdomen is soft. There is no mass.     Tenderness: There is no abdominal tenderness.   Musculoskeletal:        General: Normal range of motion.     Cervical back: Normal range of motion and neck supple.     Left lower leg: Edema (mild) present.  Lymphadenopathy:     Cervical: No cervical adenopathy.  Skin:    General: Skin is warm and dry.  Neurological:     General: No focal deficit present.     Mental Status: She is alert and oriented to person, place, and time. Mental status is at baseline.  Psychiatric:        Mood and Affect: Mood normal.        Behavior: Behavior normal.        Thought Content: Thought content normal.        Judgment: Judgment normal.     LABS:   CBC Latest Ref Rng & Units 03/19/2014  WBC 4.0 - 10.5 K/uL 9.0  Hemoglobin 12.0 - 15.0 g/dL 14.1  Hematocrit 36 - 46 % 41.8  Platelets 150 - 400 K/uL 275   CMP Latest Ref Rng & Units 03/19/2014  Glucose 70 - 99 mg/dL 80  BUN 6 - 23 mg/dL 21  Creatinine 0.50 - 1.10 mg/dL 0.79  Sodium 135 - 145 mmol/L 143  Potassium 3.5 - 5.1 mmol/L 4.1  Chloride 96 - 112 mmol/L 106  CO2 19 - 32 mmol/L 29  Calcium 8.4 - 10.5 mg/dL 9.8     STUDIES:   She underwent digital screening bilateral mammogram with tomography on 11/21/2019 showing: breast density category B.  No evidence of malignancy.  She underwent DXA for bone mineral density on 11/27/2019 showing a completely normal exam.    Allergies:  Allergies  Allergen Reactions  . Codeine Nausea And Vomiting and Other (See Comments)  . Other Other (See Comments)    hycosmine  . Hydrocodone Other (See Comments)    Seeing double  . Sulfa Antibiotics Other (See Comments)    Family history, prefers not to take    Current Medications: Current Outpatient Medications  Medication Sig Dispense Refill  . NON FORMULARY Mushroom herbal supplement    . amLODipine-benazepril (LOTREL) 5-20 MG per capsule Take 1 capsule by mouth 2 (two) times daily.    . Calcium Carb-Cholecalciferol (OS-CAL CALCIUM + D3 PO) Os-Cal + D3    . calcium-vitamin D (OSCAL WITH D)  500-200 MG-UNIT per tablet Take 1 tablet by mouth daily with breakfast.    . chlorthalidone (HYGROTON) 25 MG tablet Take 12.5 mg by mouth daily.    . Cholecalciferol (VITAMIN D3 PO) Take 1 tablet by mouth daily.    . cloNIDine (CATAPRES) 0.1 MG tablet Take 0.1 mg by mouth 2 (two) times daily.    . Glucosamine HCl (GLUCOSAMINE PO) Glucosamine    . MAGNESIUM PO magnesium    . Omega-3 Fatty Acids (FISH OIL PO) Take 1 capsule by mouth daily.    . Omega-3 Fatty Acids (FISH OIL) 1000  MG CAPS Fish Oil 1,000 mg (120 mg-180 mg) capsule  Take by oral route.    . potassium chloride SA (K-DUR,KLOR-CON) 20 MEQ tablet Take 20 mEq by mouth 2 (two) times daily.    Marland Kitchen TART [place]    . VITAMIN E PO Take 1 capsule by mouth daily.     No current facility-administered medications for this visit.     ASSESSMENT & PLAN:   Assessment:   1.  History of recurrent melanoma twice, originally diagnosed in 48. She has been disease free for many years.    2.  Longstanding lymphedema of the lower extremity as a result of her prior treatment, but this remains stable.  3.  Persistent cough for over 6 months.  This could be allergies or reflux but we will get a chest x-ray to rule out other pathology.  Plan: I have scheduled her for chest x-ray, and will call her with the results.  If this remains stable, we will repeat imaging in 1 year.  I will also call her with the results of her chemistries.  We will plan to see her back in 6 months with CBC and comprehensive metabolic profile for evaluation.  She understands and agrees with this plan of care.   I provided 15 minutes of face-to-face time during this this encounter and > 50% was spent counseling as documented under my assessment and plan.    Derwood Kaplan, MD [facility] [address] [place] [address] Dept: [phone] Dept Fax: [phone]   I, Rita Ohara, am  acting as scribe for Derwood Kaplan, MD  I have reviewed this report as typed by the medical scribe, and it is complete and accurate.

## 2019-11-27 ENCOUNTER — Telehealth: Payer: Self-pay

## 2019-11-27 DIAGNOSIS — Z13820 Encounter for screening for osteoporosis: Secondary | ICD-10-CM | POA: Diagnosis not present

## 2019-11-27 DIAGNOSIS — M8589 Other specified disorders of bone density and structure, multiple sites: Secondary | ICD-10-CM | POA: Diagnosis not present

## 2019-11-27 NOTE — Telephone Encounter (Signed)
-----   Message from Derwood Kaplan, MD sent at 11/23/2019 10:51 AM EST ----- Regarding: call pt Tell her mammo is clear

## 2019-11-27 NOTE — Telephone Encounter (Signed)
Called patient and notified her of mammo results

## 2019-11-28 ENCOUNTER — Inpatient Hospital Stay: Payer: Medicare Other | Attending: Oncology

## 2019-11-28 ENCOUNTER — Inpatient Hospital Stay (INDEPENDENT_AMBULATORY_CARE_PROVIDER_SITE_OTHER): Payer: Medicare Other | Admitting: Oncology

## 2019-11-28 ENCOUNTER — Other Ambulatory Visit: Payer: Self-pay | Admitting: Hematology and Oncology

## 2019-11-28 ENCOUNTER — Other Ambulatory Visit: Payer: Self-pay

## 2019-11-28 ENCOUNTER — Telehealth: Payer: Self-pay | Admitting: Oncology

## 2019-11-28 ENCOUNTER — Other Ambulatory Visit: Payer: Self-pay | Admitting: Oncology

## 2019-11-28 ENCOUNTER — Encounter: Payer: Self-pay | Admitting: Oncology

## 2019-11-28 VITALS — BP 139/81 | HR 65 | Temp 97.5°F | Resp 18 | Ht 61.0 in | Wt 197.0 lb

## 2019-11-28 DIAGNOSIS — C439 Malignant melanoma of skin, unspecified: Secondary | ICD-10-CM | POA: Diagnosis not present

## 2019-11-28 DIAGNOSIS — C774 Secondary and unspecified malignant neoplasm of inguinal and lower limb lymph nodes: Secondary | ICD-10-CM | POA: Diagnosis not present

## 2019-11-28 DIAGNOSIS — M5134 Other intervertebral disc degeneration, thoracic region: Secondary | ICD-10-CM | POA: Diagnosis not present

## 2019-11-28 DIAGNOSIS — C4372 Malignant melanoma of left lower limb, including hip: Secondary | ICD-10-CM

## 2019-11-28 LAB — CBC AND DIFFERENTIAL
HCT: 42 (ref 36–46)
Hemoglobin: 14.2 (ref 12.0–16.0)
Neutrophils Absolute: 3.36
Platelets: 238 (ref 150–399)
WBC: 5.7

## 2019-11-28 LAB — BASIC METABOLIC PANEL
BUN: 17 (ref 4–21)
CO2: 24 — AB (ref 13–22)
Chloride: 108 (ref 99–108)
Creatinine: 0.7 (ref 0.5–1.1)
Glucose: 110
Potassium: 4.3 (ref 3.4–5.3)
Sodium: 140 (ref 137–147)

## 2019-11-28 LAB — HEPATIC FUNCTION PANEL
ALT: 13 (ref 7–35)
AST: 18 (ref 13–35)
Alkaline Phosphatase: 70 (ref 25–125)
Bilirubin, Total: 0.8

## 2019-11-28 LAB — COMPREHENSIVE METABOLIC PANEL
Albumin: 3.9 (ref 3.5–5.0)
Calcium: 9.2 (ref 8.7–10.7)

## 2019-11-28 LAB — CBC: RBC: 4.73 (ref 3.87–5.11)

## 2019-11-28 NOTE — Telephone Encounter (Signed)
Per 11/23 LOS, patient scheduled X-Ray Chest today, 05/27/20 Labs, Follow up.  Gave patient order, patient Appt Summary

## 2019-12-04 ENCOUNTER — Telehealth: Payer: Self-pay

## 2019-12-04 NOTE — Telephone Encounter (Signed)
Patient notified and copy sent to PCP

## 2019-12-04 NOTE — Telephone Encounter (Signed)
-----   Message from Derwood Kaplan, MD sent at 11/29/2019  8:55 AM EST ----- Regarding: call pt Tell her rest of labs good, incl BS 110.  Chest xray is clear, send copies to her PCP

## 2020-01-18 ENCOUNTER — Encounter: Payer: Self-pay | Admitting: Oncology

## 2020-02-07 DIAGNOSIS — Z03818 Encounter for observation for suspected exposure to other biological agents ruled out: Secondary | ICD-10-CM | POA: Diagnosis not present

## 2020-02-07 DIAGNOSIS — Z20822 Contact with and (suspected) exposure to covid-19: Secondary | ICD-10-CM | POA: Diagnosis not present

## 2020-03-14 DIAGNOSIS — Z6838 Body mass index (BMI) 38.0-38.9, adult: Secondary | ICD-10-CM | POA: Diagnosis not present

## 2020-03-14 DIAGNOSIS — I10 Essential (primary) hypertension: Secondary | ICD-10-CM | POA: Diagnosis not present

## 2020-03-14 DIAGNOSIS — E669 Obesity, unspecified: Secondary | ICD-10-CM | POA: Diagnosis not present

## 2020-05-17 DIAGNOSIS — M79605 Pain in left leg: Secondary | ICD-10-CM | POA: Diagnosis not present

## 2020-05-17 DIAGNOSIS — L03116 Cellulitis of left lower limb: Secondary | ICD-10-CM | POA: Diagnosis not present

## 2020-05-20 DIAGNOSIS — M79662 Pain in left lower leg: Secondary | ICD-10-CM | POA: Diagnosis not present

## 2020-05-20 DIAGNOSIS — M7989 Other specified soft tissue disorders: Secondary | ICD-10-CM | POA: Diagnosis not present

## 2020-05-20 DIAGNOSIS — M25569 Pain in unspecified knee: Secondary | ICD-10-CM | POA: Diagnosis not present

## 2020-05-21 DIAGNOSIS — M25562 Pain in left knee: Secondary | ICD-10-CM | POA: Diagnosis not present

## 2020-05-28 ENCOUNTER — Inpatient Hospital Stay: Payer: Medicare Other | Admitting: Oncology

## 2020-05-28 ENCOUNTER — Other Ambulatory Visit: Payer: Self-pay

## 2020-05-28 ENCOUNTER — Inpatient Hospital Stay: Payer: Medicare Other | Attending: Oncology

## 2020-05-28 ENCOUNTER — Other Ambulatory Visit: Payer: Self-pay | Admitting: Hematology and Oncology

## 2020-05-28 ENCOUNTER — Encounter: Payer: Self-pay | Admitting: Hematology and Oncology

## 2020-05-28 DIAGNOSIS — C774 Secondary and unspecified malignant neoplasm of inguinal and lower limb lymph nodes: Secondary | ICD-10-CM | POA: Diagnosis not present

## 2020-05-28 DIAGNOSIS — D649 Anemia, unspecified: Secondary | ICD-10-CM | POA: Diagnosis not present

## 2020-05-28 LAB — COMPREHENSIVE METABOLIC PANEL
Albumin: 3.9 (ref 3.5–5.0)
Calcium: 8.6 — AB (ref 8.7–10.7)

## 2020-05-28 LAB — CBC AND DIFFERENTIAL
HCT: 43 (ref 36–46)
Hemoglobin: 14.2 (ref 12.0–16.0)
Neutrophils Absolute: 3.4
Platelets: 240 (ref 150–399)
WBC: 6.3

## 2020-05-28 LAB — HEPATIC FUNCTION PANEL
ALT: 12 (ref 7–35)
AST: 18 (ref 13–35)
Alkaline Phosphatase: 66 (ref 25–125)
Bilirubin, Total: 0.9

## 2020-05-28 LAB — BASIC METABOLIC PANEL
BUN: 22 — AB (ref 4–21)
CO2: 24 — AB (ref 13–22)
Chloride: 106 (ref 99–108)
Creatinine: 0.8 (ref 0.5–1.1)
Glucose: 100
Potassium: 4.1 (ref 3.4–5.3)
Sodium: 136 — AB (ref 137–147)

## 2020-05-28 LAB — CBC: RBC: 4.8 (ref 3.87–5.11)

## 2020-05-29 DIAGNOSIS — M25562 Pain in left knee: Secondary | ICD-10-CM | POA: Diagnosis not present

## 2020-05-30 ENCOUNTER — Inpatient Hospital Stay (INDEPENDENT_AMBULATORY_CARE_PROVIDER_SITE_OTHER): Payer: Medicare Other | Admitting: Physician Assistant

## 2020-05-30 ENCOUNTER — Other Ambulatory Visit: Payer: Self-pay

## 2020-05-30 VITALS — BP 147/79 | HR 83 | Temp 97.9°F | Resp 18 | Wt 195.2 lb

## 2020-05-30 DIAGNOSIS — C774 Secondary and unspecified malignant neoplasm of inguinal and lower limb lymph nodes: Secondary | ICD-10-CM | POA: Diagnosis not present

## 2020-05-30 DIAGNOSIS — C4372 Malignant melanoma of left lower limb, including hip: Secondary | ICD-10-CM | POA: Diagnosis not present

## 2020-05-30 DIAGNOSIS — Z1231 Encounter for screening mammogram for malignant neoplasm of breast: Secondary | ICD-10-CM | POA: Diagnosis not present

## 2020-05-30 NOTE — Progress Notes (Signed)
[facility]  [address] [place],  [place]  [address] [phone]  Clinic Day:  05/30/2020  Referring physician: Greig Right, MD    CHIEF COMPLAINT:  CC: History of stage IIA malignant melanoma  Current Treatment:  Surveillance   HISTORY OF PRESENT ILLNESS:  Latasha Wells is a 80 y.o. female with a history of stage IIA malignant melanoma originally diagnosed in 29.  She had a Clark's level II with a Breslow thickness of 7.7 mm, treated with wide excision.  In 1997, she had a recurrence in the left groin and underwent node dissection, followed by 1 year of alpha interferon with Consuello Masse, MD at [facility].  In 1998, when she had just 1 week remaining on the interferon therapy, she developed a recurrence in the left groin again.  She then saw Willadean Carol, MD in [place] and underwent a deep node dissection of the left groin, followed by experimental immune therapy.  She has not had any evidence of recurrence since that time.  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  She sees her dermatologist, Dr. Altamese Cabal, in [facility], regularly.  She states she is up-to-date on screening colonoscopy.  Ultrasound of the abdomen limited right upper quadrant from November 2020 was clear other than suggestion of mild hepatic steatosis.     INTERVAL HISTORY:  Latasha Wells is here for routine follow up while on surveillance. She continues to do well without any significant limitations. Her appetite and energy levels are stable. She is able to complete all her ADLs on her own. She denies any changes to her weight. Patient denies any nausea, vomiting or abdominal pain. Her bowel movements are regular without any constipation or diarrhea. Patient denies easy bruising or signs of bleeding. She notes that her cough has improved. She reports intermittent episodes of tingling in her feet when she wakes up in the morning.  Patient denies any fevers, chills, night sweats, shortness of breath or chest pain. She denies any new skin lesions or rash. She has no other complaints.   REVIEW OF SYSTEMS:  Review of Systems  Constitutional: Negative for appetite change, chills, fatigue and fever.  Respiratory: Positive for cough (intermittent but improved). Negative for hemoptysis and shortness of breath.   Cardiovascular: Negative for chest pain.  Gastrointestinal: Negative for abdominal pain, blood in stool, constipation, diarrhea, nausea and vomiting.  Musculoskeletal: Negative for back pain and neck pain.  Skin: Negative for itching and rash.  Neurological: Negative for dizziness and headaches.  Hematological: Negative for adenopathy.  All other systems reviewed and are negative.    VITALS:  Blood pressure (!) 147/79, pulse 83, temperature 97.9 F (36.6 C), temperature source Oral, resp. rate 18, weight 195 lb 4 oz (88.6 kg), SpO2 98 %.  Wt Readings from Last 3 Encounters:  05/30/20 195 lb 4 oz (88.6 kg)  11/28/19 197 lb (89.4 kg)  03/19/14 193 lb (87.5 kg)    Body mass index is 36.89 kg/m.  Performance status (ECOG): 0 - Asymptomatic  PHYSICAL EXAM:  Physical Exam Constitutional:      General: She is not in acute distress.    Appearance: Normal appearance. She is normal weight.  HENT:     Head: Normocephalic and atraumatic.  Eyes:     General: No scleral icterus.    Extraocular Movements: Extraocular movements intact.     Conjunctiva/sclera: Conjunctivae normal.     Pupils: Pupils are equal, round, and  reactive to light.  Cardiovascular:     Rate and Rhythm: Normal rate and regular rhythm.     Pulses: Normal pulses.     Heart sounds: Normal heart sounds. No murmur heard. No friction rub. No gallop.   Pulmonary:     Effort: Pulmonary effort is normal. No respiratory distress.     Breath sounds: Normal breath sounds.  Abdominal:     General: Bowel sounds are normal. There is no distension.      Palpations: Abdomen is soft. There is no mass.     Tenderness: There is no abdominal tenderness.  Musculoskeletal:        General: Normal range of motion.     Cervical back: Normal range of motion.     Left lower leg: Edema (mild) present.  Lymphadenopathy:     Cervical: No cervical adenopathy.  Skin:    General: Skin is warm and dry.  Neurological:     General: No focal deficit present.     Mental Status: She is alert and oriented to person, place, and time. Mental status is at baseline.  Psychiatric:        Mood and Affect: Mood normal.        Behavior: Behavior normal.        Thought Content: Thought content normal.        Judgment: Judgment normal.     LABS:   CBC Latest Ref Rng & Units 05/28/2020 11/28/2019 03/19/2014  WBC - 6.3 5.7 9.0  Hemoglobin 12.0 - 16.0 14.2 14.2 14.1  Hematocrit 36 - 46 43 42 41.8  Platelets 150 - 399 240 238 275   CMP Latest Ref Rng & Units 05/28/2020 11/28/2019 03/19/2014  Glucose 70 - 99 mg/dL - - 80  BUN 4 - 21 22(A) 17 21  Creatinine 0.5 - 1.1 0.8 0.7 0.79  Sodium 137 - 147 136(A) 140 143  Potassium 3.4 - 5.3 4.1 4.3 4.1  Chloride 99 - 108 106 108 106  CO2 13 - 22 24(A) 24(A) 29  Calcium 8.7 - 10.7 8.6(A) 9.2 9.8  Alkaline Phos 25 - 125 66 70 -  AST 13 - 35 18 18 -  ALT 7 - 35 12 13 -     STUDIES:   She underwent digital screening bilateral mammogram with tomography on 11/21/2019 showing: breast density category B.  No evidence of malignancy.  She underwent DXA for bone mineral density on 11/27/2019 showing a completely normal exam.    Allergies:  Allergies  Allergen Reactions  . Codeine Nausea And Vomiting and Other (See Comments)  . Other Other (See Comments)    hycosmine  . Hydrocodone Other (See Comments)    Seeing double  . Sulfa Antibiotics Other (See Comments)    Family history, prefers not to take    Current Medications: Current Outpatient Medications  Medication Sig Dispense Refill  . amLODipine-benazepril  (LOTREL) 5-20 MG per capsule Take 1 capsule by mouth 2 (two) times daily.    . Calcium Carb-Cholecalciferol (OS-CAL CALCIUM + D3 PO) Os-Cal + D3    . calcium-vitamin D (OSCAL WITH D) 500-200 MG-UNIT per tablet Take 1 tablet by mouth daily with breakfast.    . chlorthalidone (HYGROTON) 25 MG tablet Take 12.5 mg by mouth daily.    . Cholecalciferol (VITAMIN D3 PO) Take 1 tablet by mouth daily.    . cloNIDine (CATAPRES) 0.1 MG tablet Take 0.1 mg by mouth 2 (two) times daily.    . Glucosamine HCl (  GLUCOSAMINE PO) Glucosamine    . MAGNESIUM PO magnesium    . NON FORMULARY Mushroom herbal supplement    . Omega-3 Fatty Acids (FISH OIL PO) Take 1 capsule by mouth daily.    . Omega-3 Fatty Acids (FISH OIL) 1000 MG CAPS Fish Oil 1,000 mg (120 mg-180 mg) capsule  Take by oral route.    . potassium chloride SA (K-DUR,KLOR-CON) 20 MEQ tablet Take 20 mEq by mouth 2 (two) times daily.    [place] Kitchen TART [place]    . VITAMIN E PO Take 1 capsule by mouth daily.     No current facility-administered medications for this visit.     ASSESSMENT & PLAN:   Assessment:   1.  History of recurrent melanoma twice, originally diagnosed in 28.  -She has been disease free for many years. Patient is doing well without any new or concerning symptoms.   2.  Longstanding lymphedema of the lower extremity as a result of her prior treatment: -Stable, continue to monitor.   3. Hx of cough: -Chest xray from November 2021 was unremarkable. -Patient reports improvement of cough at today's visit.  -Continue to monitor.   4. Hypocalcemia, mild: --Calcium level was 8.6 (normal range is 8.7-10.7). Advised to continue to take calcium supplementation. No intervention is required at this time.   Plan: Patient  will continue on surveillance. She is scheduled for her annual skin exam with her dermatologist around August 2022. Patient will return to the clinic around November 2022 with repeat labs. She will be due for her  mammogram around that time as well.    Patient expressed understanding and satisfaction with the plan provided.   I have spent a total of 25 minutes minutes of face-to-face and non-face-to-face time, preparing to see the patient, obtaining and/or reviewing separately obtained history, performing a medically appropriate examination, counseling and educating the patient, ordering tests, documenting clinical information in the electronic health record, and care coordination.    Lincoln Brigham, [facility] AT [facility] [place] [address] Dept: [phone] Dept Fax: [phone]

## 2020-08-19 DIAGNOSIS — D485 Neoplasm of uncertain behavior of skin: Secondary | ICD-10-CM | POA: Diagnosis not present

## 2020-08-19 DIAGNOSIS — D1801 Hemangioma of skin and subcutaneous tissue: Secondary | ICD-10-CM | POA: Diagnosis not present

## 2020-08-19 DIAGNOSIS — D1722 Benign lipomatous neoplasm of skin and subcutaneous tissue of left arm: Secondary | ICD-10-CM | POA: Diagnosis not present

## 2020-08-19 DIAGNOSIS — Z85820 Personal history of malignant melanoma of skin: Secondary | ICD-10-CM | POA: Diagnosis not present

## 2020-08-19 DIAGNOSIS — C44712 Basal cell carcinoma of skin of right lower limb, including hip: Secondary | ICD-10-CM | POA: Diagnosis not present

## 2020-08-19 DIAGNOSIS — D235 Other benign neoplasm of skin of trunk: Secondary | ICD-10-CM | POA: Diagnosis not present

## 2020-09-23 DIAGNOSIS — C44712 Basal cell carcinoma of skin of right lower limb, including hip: Secondary | ICD-10-CM | POA: Diagnosis not present

## 2020-12-10 DIAGNOSIS — D239 Other benign neoplasm of skin, unspecified: Secondary | ICD-10-CM | POA: Diagnosis not present

## 2020-12-10 DIAGNOSIS — L720 Epidermal cyst: Secondary | ICD-10-CM | POA: Diagnosis not present

## 2020-12-10 DIAGNOSIS — C44712 Basal cell carcinoma of skin of right lower limb, including hip: Secondary | ICD-10-CM | POA: Diagnosis not present

## 2020-12-18 DIAGNOSIS — R21 Rash and other nonspecific skin eruption: Secondary | ICD-10-CM | POA: Diagnosis not present

## 2021-01-07 DIAGNOSIS — J209 Acute bronchitis, unspecified: Secondary | ICD-10-CM | POA: Diagnosis not present

## 2021-01-20 DIAGNOSIS — R102 Pelvic and perineal pain: Secondary | ICD-10-CM | POA: Diagnosis not present

## 2021-04-03 DIAGNOSIS — Z6838 Body mass index (BMI) 38.0-38.9, adult: Secondary | ICD-10-CM | POA: Diagnosis not present

## 2021-04-03 DIAGNOSIS — I10 Essential (primary) hypertension: Secondary | ICD-10-CM | POA: Diagnosis not present

## 2021-04-03 DIAGNOSIS — Z0000 Encounter for general adult medical examination without abnormal findings: Secondary | ICD-10-CM | POA: Diagnosis not present

## 2021-04-03 DIAGNOSIS — E669 Obesity, unspecified: Secondary | ICD-10-CM | POA: Diagnosis not present

## 2021-04-03 DIAGNOSIS — N959 Unspecified menopausal and perimenopausal disorder: Secondary | ICD-10-CM | POA: Diagnosis not present

## 2021-04-23 ENCOUNTER — Other Ambulatory Visit: Payer: Self-pay | Admitting: Oncology

## 2021-04-23 ENCOUNTER — Telehealth: Payer: Self-pay

## 2021-04-23 DIAGNOSIS — L300 Nummular dermatitis: Secondary | ICD-10-CM | POA: Diagnosis not present

## 2021-04-23 DIAGNOSIS — L820 Inflamed seborrheic keratosis: Secondary | ICD-10-CM | POA: Diagnosis not present

## 2021-04-23 DIAGNOSIS — C4372 Malignant melanoma of left lower limb, including hip: Secondary | ICD-10-CM

## 2021-04-23 NOTE — Progress Notes (Signed)
?[facility]  ?[address] ?[place],  [place]  [address] ?[phone] ? ?Clinic Day:  04/24/21 ? ?Referring physician: Greig Right, MD  ? ?She needs mammo ? ?CHIEF COMPLAINT:  ?CC: History of stage IIA malignant melanoma ? ?Current Treatment:  Surveillance ? ? ?HISTORY OF PRESENT ILLNESS:  ?Latasha Wells is a 81 y.o. female with a history of stage IIA malignant melanoma originally diagnosed in 71.  She had a Clark's level II with a Breslow thickness of 7.7 mm, treated with wide excision.  In 1997, she had a recurrence in the left groin and underwent node dissection, followed by 1 year of alpha interferon with Consuello Masse, MD at [facility].  In 1998, when she had just 1 week remaining on the interferon therapy, she developed a recurrence in the left groin again.  She then saw Willadean Carol, MD in [place] and underwent a deep node dissection of the left groin, followed by experimental immune therapy.  She has not had any evidence of recurrence since that time.  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  She sees her dermatologist, Dr. Altamese Cabal, in [facility], regularly.  She states she is up-to-date on screening colonoscopy.  Ultrasound of the abdomen limited right upper quadrant from November 2020 was clear other than suggestion of mild hepatic steatosis.  Bone density scan in November 2021 was normal. ? ? ?INTERVAL HISTORY:  ?Latasha Wells is here for routine follow up and states that she has been well other than occasional shortness of breath with exertion.  She requests that I check her lipid panel and forwarded to Dr. Burnett Sheng and so we will do so.  She also did her breast exam.  I did have her get a chest x-ray today and that is clear.  Annual bilateral mammogram from November of 2021 was clear.  I will offer to schedule her mammography if Dr. Burnett Sheng has not already done so.  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  This is due to her chronic lymphedema of the left leg.  She does use compression hose.  Blood counts and chemistries are unremarkable.  Lipid panel looks good.  Her  appetite is good, and she has gained 4 pounds since her last visit.  She denies fever, chills or other signs of infection.  She denies nausea, vomiting, bowel issues, or abdominal pain.  She denies sore throat, cough, or chest pain. ? ? ?REVIEW OF SYSTEMS:  ?Review of Systems  ?Constitutional: Negative.   ?HENT:  Negative.    ?Eyes: Negative.   ?Respiratory:  Positive for shortness of breath. Cough: intermittent but persistent, sometimes productive with white sputum.  ?Cardiovascular:  Positive for leg swelling.  ?Gastrointestinal: Negative.   ?Endocrine: Negative.   ?Genitourinary: Negative.    ?Musculoskeletal: Negative.   ?Neurological: Negative.   ?Hematological: Negative.   ?Psychiatric/Behavioral: Negative.     ? ?VITALS:  ?Blood pressure 135/76, pulse 64, temperature (!) 97.5 ?F (36.4 ?C), temperature source Oral, resp. rate 20, height '5\' 1"'$  (1.549 m), weight 199 lb (90.3 kg), SpO2 97 %.  ?Wt Readings from Last 3 Encounters:  ?04/24/21 199 lb (90.3 kg)  ?05/30/20 195 lb 4 oz (88.6 kg)  ?11/28/19 197 lb (89.4 kg)  ?  ?Body mass index is 37.6 kg/m?. ? ?Performance status (ECOG): 1 - Symptomatic but completely  ambulatory ? ?PHYSICAL EXAM:  ?Physical Exam ?Constitutional:   ?   General: She is not in acute distress. ?   Appearance: Normal appearance. She is normal weight.  ?HENT:  ?   Head: Normocephalic and atraumatic.  ?Eyes:  ?   General: No scleral icterus. ?   Extraocular Movements: Extraocular movements intact.  ?   Conjunctiva/sclera: Conjunctivae normal.  ?   Pupils: Pupils are equal, round, and reactive to light.  ?Cardiovascular:  ?   Rate and Rhythm: Normal rate and regular rhythm.  ?   Pulses: Normal pulses.  ?   Heart sounds: Normal heart sounds. No  murmur heard. ?  No friction rub. No gallop.  ?Pulmonary:  ?   Effort: Pulmonary effort is normal. No respiratory distress.  ?   Breath sounds: Normal breath sounds.  ?Abdominal:  ?   General: Bowel sounds are normal. There is no distension.  ?   Palpations: Abdomen is soft. There is no mass.  ?   Tenderness: There is no abdominal tenderness.  ?Musculoskeletal:     ?   General: Normal range of motion.  ?   Cervical back: Normal range of motion and neck supple.  ?   Left lower leg: Edema (mild) present.  ?Lymphadenopathy:  ?   Cervical: No cervical adenopathy.  ?Skin: ?   General: Skin is warm and dry.  ?Neurological:  ?   General: No focal deficit present.  ?   Mental Status: She is alert and oriented to person, place, and time. Mental status is at baseline.  ?Psychiatric:     ?   Mood and Affect: Mood normal.     ?   Behavior: Behavior normal.     ?   Thought Content: Thought content normal.     ?   Judgment: Judgment normal.  ? ? ?LABS:  ? ? ?  Latest Ref Rng & Units 04/24/2021  ? 12:00 AM 05/28/2020  ? 12:00 AM 11/28/2019  ? 12:00 AM  ?CBC  ?WBC  6.6      6.3   5.7       ?Hemoglobin 12.0 - 16.0 13.8      14.2   14.2       ?Hematocrit 36 - 46 42      43   42       ?Platelets 150 - 400 K/uL 257      240   238       ?  ? This result is from an external source.  ? ? ?  Latest Ref Rng & Units 04/24/2021  ? 12:00 AM 05/28/2020  ? 12:00 AM 11/28/2019  ? 12:00 AM  ?CMP  ?BUN 4 - '21 22      22   17       '$ ?Creatinine 0.5 - 1.1 1.0      0.8   0.7       ?Sodium 137 - 147 142      136   140       ?Potassium 3.5 - 5.1 mEq/L 4.7      4.1   4.3       ?Chloride 99 - 108 107      106   108       ?CO2 13 - '22 25      24   24       '$ ?Calcium 8.7 - 10.7 9.3      8.6   9.2       ?  Alkaline Phos 25 - 125 59      66   70       ?AST 13 - 35 '20      18   18       '$ ?ALT 7 - 35 U/L '17      12   13       '$ ?  ? This result is from an external source.  ? ? ? ?STUDIES:  ?Chest x-ray, PA and lateral, is clear. ? ?Allergies:  ?Allergies  ?Allergen  Reactions  ? Codeine Nausea And Vomiting and Other (See Comments)  ? Other Other (See Comments)  ?  hycosmine  ? Hydrocodone Other (See Comments)  ?  Seeing double  ? Sulfa Antibiotics Other (See Comments)  ?  Family history, prefers not to take  ? Amoxicillin Rash  ? ? ?Current Medications: ?Current Outpatient Medications  ?Medication Sig Dispense Refill  ? amLODipine (NORVASC) 10 MG tablet Take 10 mg by mouth daily.    ? amLODipine-benazepril (LOTREL) 5-20 MG per capsule Take 1 capsule by mouth 2 (two) times daily. (Patient not taking: Reported on 04/24/2021)    ? benazepril (LOTENSIN) 40 MG tablet Take 40 mg by mouth daily.    ? Calcium Carb-Cholecalciferol (OS-CAL CALCIUM + D3 PO) Os-Cal + D3    ? calcium-vitamin D (OSCAL WITH D) 500-200 MG-UNIT per tablet Take 1 tablet by mouth daily with breakfast.    ? chlorthalidone (HYGROTON) 25 MG tablet Take 12.5 mg by mouth daily. (Patient not taking: Reported on 04/24/2021)    ? Cholecalciferol (VITAMIN D3 PO) Take 1 tablet by mouth daily.    ? cloNIDine (CATAPRES) 0.1 MG tablet Take 0.1 mg by mouth 2 (two) times daily.    ? Glucosamine HCl (GLUCOSAMINE PO) Glucosamine    ? MAGNESIUM PO magnesium    ? nebivolol (BYSTOLIC) 5 MG tablet Take 10 mg by mouth.    ? NON FORMULARY Mushroom herbal supplement    ? Omega-3 Fatty Acids (FISH OIL PO) Take 1 capsule by mouth daily.    ? Omega-3 Fatty Acids (FISH OIL) 1000 MG CAPS Fish Oil 1,000 mg (120 mg-180 mg) capsule ? Take by oral route.    ? potassium chloride SA (K-DUR,KLOR-CON) 20 MEQ tablet Take 20 mEq by mouth 2 (two) times daily.    ? TART CHERRY PO Tart [place]    ? triamcinolone cream (KENALOG) 0.1 % Apply topically.    ? VITAMIN E PO Take 1 capsule by mouth daily.    ? ?No current facility-administered medications for this visit.  ? ? ? ?ASSESSMENT & PLAN:  ? ?Assessment:   ?1.  History of recurrent melanoma twice, originally diagnosed in 29. She has been disease free for many years.   ? ?2.  Longstanding lymphedema of  the lower extremity as a result of her prior treatment, but this remains stable. ? ?3.  Occasional dyspnea with exertion.  Her chest x-ray is clear.   ? 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

## 2021-04-23 NOTE — Telephone Encounter (Addendum)
Pt notified. ?----- Message from Derwood Kaplan, MD sent at 04/23/2021  9:56 AM EDT ----- ?Regarding: RE: cxray ?Yes, I usually do one yearly, last one I did was Nov 2021.  I will put in order, schedulers will need to set it up prior to her appt ?----- Message ----- ?From: Dairl Ponder, RN ?Sent: 04/23/2021   9:52 AM EDT ?To: Derwood Kaplan, MD ?Subject: cxray                                         ? ?Pt states she hasn't had cxray lately. She is req to have cxray while she is here tomorrow (she is scheduled to see you for f/u) ? ? ?

## 2021-04-24 ENCOUNTER — Other Ambulatory Visit: Payer: Self-pay | Admitting: Oncology

## 2021-04-24 ENCOUNTER — Inpatient Hospital Stay: Payer: Medicare Other

## 2021-04-24 ENCOUNTER — Encounter: Payer: Self-pay | Admitting: Oncology

## 2021-04-24 ENCOUNTER — Other Ambulatory Visit: Payer: Self-pay

## 2021-04-24 ENCOUNTER — Other Ambulatory Visit: Payer: Self-pay | Admitting: Hematology and Oncology

## 2021-04-24 ENCOUNTER — Telehealth: Payer: Self-pay | Admitting: Oncology

## 2021-04-24 ENCOUNTER — Inpatient Hospital Stay: Payer: Medicare Other | Attending: Oncology | Admitting: Oncology

## 2021-04-24 VITALS — BP 135/76 | HR 64 | Temp 97.5°F | Resp 20 | Ht 61.0 in | Wt 199.0 lb

## 2021-04-24 DIAGNOSIS — C4372 Malignant melanoma of left lower limb, including hip: Secondary | ICD-10-CM

## 2021-04-24 DIAGNOSIS — Z79899 Other long term (current) drug therapy: Secondary | ICD-10-CM | POA: Insufficient documentation

## 2021-04-24 DIAGNOSIS — Z85820 Personal history of malignant melanoma of skin: Secondary | ICD-10-CM | POA: Insufficient documentation

## 2021-04-24 DIAGNOSIS — I890 Lymphedema, not elsewhere classified: Secondary | ICD-10-CM | POA: Insufficient documentation

## 2021-04-24 DIAGNOSIS — R0600 Dyspnea, unspecified: Secondary | ICD-10-CM | POA: Diagnosis not present

## 2021-04-24 DIAGNOSIS — C774 Secondary and unspecified malignant neoplasm of inguinal and lower limb lymph nodes: Secondary | ICD-10-CM

## 2021-04-24 DIAGNOSIS — E785 Hyperlipidemia, unspecified: Secondary | ICD-10-CM

## 2021-04-24 LAB — BASIC METABOLIC PANEL
BUN: 22 — AB (ref 4–21)
CO2: 25 — AB (ref 13–22)
Chloride: 107 (ref 99–108)
Creatinine: 1 (ref 0.5–1.1)
Glucose: 92
Potassium: 4.7 mEq/L (ref 3.5–5.1)
Sodium: 142 (ref 137–147)

## 2021-04-24 LAB — CBC: RBC: 4.76 (ref 3.87–5.11)

## 2021-04-24 LAB — HEPATIC FUNCTION PANEL
ALT: 17 U/L (ref 7–35)
AST: 20 (ref 13–35)
Alkaline Phosphatase: 59 (ref 25–125)
Bilirubin, Total: 0.5

## 2021-04-24 LAB — CBC AND DIFFERENTIAL
HCT: 42 (ref 36–46)
Hemoglobin: 13.8 (ref 12.0–16.0)
Neutrophils Absolute: 3.7
Platelets: 257 10*3/uL (ref 150–400)
WBC: 6.6

## 2021-04-24 LAB — LIPID PANEL
Cholesterol: 183 mg/dL (ref 0–200)
HDL: 56 mg/dL (ref >40)
LDL Cholesterol: 98 mg/dL (ref 0–99)
Total CHOL/HDL Ratio: 3.3 RATIO
Triglycerides: 143 mg/dL (ref <150)
VLDL: 29 mg/dL (ref 0–40)

## 2021-04-24 LAB — COMPREHENSIVE METABOLIC PANEL
Albumin: 4.1 (ref 3.5–5.0)
Calcium: 9.3 (ref 8.7–10.7)

## 2021-04-24 NOTE — Telephone Encounter (Signed)
Per 04/24/21 los next appt scheduled and confirmed with patient ?

## 2021-04-28 ENCOUNTER — Telehealth: Payer: Self-pay

## 2021-04-28 NOTE — Telephone Encounter (Signed)
Faxed lipid panel to Dr. Florina Ou office ?

## 2021-04-28 NOTE — Telephone Encounter (Signed)
Opened in error

## 2021-04-28 NOTE — Telephone Encounter (Addendum)
Pt notified of below and that Dr Hinton Rao has completed the paperwork she left. The paper work can be picked up @ front desk. Pt verbalized understanding. ? ?----- Message from Derwood Kaplan, MD sent at 04/27/2021  6:07 PM EDT ----- ?Regarding: call ?Tell her lipid panel looks great, send to her PCP (Dr. Burnett Sheng?) ? ?

## 2021-05-05 ENCOUNTER — Encounter: Payer: Self-pay | Admitting: Oncology

## 2021-05-06 ENCOUNTER — Telehealth: Payer: Self-pay

## 2021-05-06 NOTE — Telephone Encounter (Signed)
-----   Message from Derwood Kaplan, MD sent at 05/05/2021  8:22 AM EDT ----- ?Regarding: call ?I see her last mammo was Nov of 2021.   Is Dr. Burnett Sheng scheduling that or shall I? ? ?

## 2021-05-09 ENCOUNTER — Telehealth: Payer: Self-pay

## 2021-05-09 NOTE — Telephone Encounter (Signed)
Patient states that her Mammo has been rescheduled for 05/16/21. ?

## 2021-05-09 NOTE — Telephone Encounter (Signed)
-----   Message from Derwood Kaplan, MD sent at 05/05/2021  8:22 AM EDT ----- ?Regarding: call ?I see her last mammo was Nov of 2021.   Is Dr. Burnett Sheng scheduling that or shall I? ? ?

## 2021-05-15 DIAGNOSIS — I10 Essential (primary) hypertension: Secondary | ICD-10-CM | POA: Diagnosis not present

## 2021-05-15 DIAGNOSIS — Z6838 Body mass index (BMI) 38.0-38.9, adult: Secondary | ICD-10-CM | POA: Diagnosis not present

## 2021-05-15 DIAGNOSIS — I4891 Unspecified atrial fibrillation: Secondary | ICD-10-CM | POA: Diagnosis not present

## 2021-05-15 DIAGNOSIS — R0602 Shortness of breath: Secondary | ICD-10-CM | POA: Diagnosis not present

## 2021-05-16 ENCOUNTER — Encounter: Payer: Self-pay | Admitting: Oncology

## 2021-05-16 DIAGNOSIS — D241 Benign neoplasm of right breast: Secondary | ICD-10-CM | POA: Diagnosis not present

## 2021-05-16 DIAGNOSIS — N6489 Other specified disorders of breast: Secondary | ICD-10-CM | POA: Diagnosis not present

## 2021-05-16 DIAGNOSIS — R928 Other abnormal and inconclusive findings on diagnostic imaging of breast: Secondary | ICD-10-CM | POA: Diagnosis not present

## 2021-05-20 ENCOUNTER — Encounter: Payer: Self-pay | Admitting: Oncology

## 2021-06-12 DIAGNOSIS — Z7901 Long term (current) use of anticoagulants: Secondary | ICD-10-CM | POA: Diagnosis not present

## 2021-06-12 DIAGNOSIS — R0609 Other forms of dyspnea: Secondary | ICD-10-CM | POA: Diagnosis not present

## 2021-06-12 DIAGNOSIS — I10 Essential (primary) hypertension: Secondary | ICD-10-CM | POA: Diagnosis not present

## 2021-06-12 DIAGNOSIS — I4891 Unspecified atrial fibrillation: Secondary | ICD-10-CM | POA: Diagnosis not present

## 2021-06-13 DIAGNOSIS — R0609 Other forms of dyspnea: Secondary | ICD-10-CM | POA: Diagnosis not present

## 2021-06-13 DIAGNOSIS — I4891 Unspecified atrial fibrillation: Secondary | ICD-10-CM | POA: Diagnosis not present

## 2021-06-24 DIAGNOSIS — R0609 Other forms of dyspnea: Secondary | ICD-10-CM | POA: Diagnosis not present

## 2021-06-24 DIAGNOSIS — I4891 Unspecified atrial fibrillation: Secondary | ICD-10-CM | POA: Diagnosis not present

## 2021-06-27 DIAGNOSIS — I4891 Unspecified atrial fibrillation: Secondary | ICD-10-CM | POA: Diagnosis not present

## 2021-08-05 DIAGNOSIS — Z7901 Long term (current) use of anticoagulants: Secondary | ICD-10-CM | POA: Diagnosis not present

## 2021-08-05 DIAGNOSIS — R0609 Other forms of dyspnea: Secondary | ICD-10-CM | POA: Diagnosis not present

## 2021-08-05 DIAGNOSIS — I495 Sick sinus syndrome: Secondary | ICD-10-CM | POA: Diagnosis not present

## 2021-08-05 DIAGNOSIS — I272 Pulmonary hypertension, unspecified: Secondary | ICD-10-CM | POA: Diagnosis not present

## 2021-08-05 DIAGNOSIS — I10 Essential (primary) hypertension: Secondary | ICD-10-CM | POA: Diagnosis not present

## 2021-08-05 DIAGNOSIS — I4891 Unspecified atrial fibrillation: Secondary | ICD-10-CM | POA: Diagnosis not present

## 2021-08-25 DIAGNOSIS — Z808 Family history of malignant neoplasm of other organs or systems: Secondary | ICD-10-CM | POA: Diagnosis not present

## 2021-08-25 DIAGNOSIS — Z129 Encounter for screening for malignant neoplasm, site unspecified: Secondary | ICD-10-CM | POA: Diagnosis not present

## 2021-08-25 DIAGNOSIS — L821 Other seborrheic keratosis: Secondary | ICD-10-CM | POA: Diagnosis not present

## 2021-08-25 DIAGNOSIS — Z85828 Personal history of other malignant neoplasm of skin: Secondary | ICD-10-CM | POA: Diagnosis not present

## 2021-08-25 DIAGNOSIS — Z85820 Personal history of malignant melanoma of skin: Secondary | ICD-10-CM | POA: Diagnosis not present

## 2021-08-25 DIAGNOSIS — L820 Inflamed seborrheic keratosis: Secondary | ICD-10-CM | POA: Diagnosis not present

## 2021-09-23 DIAGNOSIS — I10 Essential (primary) hypertension: Secondary | ICD-10-CM | POA: Diagnosis not present

## 2021-09-23 DIAGNOSIS — I4891 Unspecified atrial fibrillation: Secondary | ICD-10-CM | POA: Diagnosis not present

## 2021-09-23 DIAGNOSIS — Z7901 Long term (current) use of anticoagulants: Secondary | ICD-10-CM | POA: Diagnosis not present

## 2021-09-23 DIAGNOSIS — I272 Pulmonary hypertension, unspecified: Secondary | ICD-10-CM | POA: Diagnosis not present

## 2021-09-23 DIAGNOSIS — R0609 Other forms of dyspnea: Secondary | ICD-10-CM | POA: Diagnosis not present

## 2021-09-23 DIAGNOSIS — I4819 Other persistent atrial fibrillation: Secondary | ICD-10-CM | POA: Diagnosis not present

## 2021-09-23 DIAGNOSIS — I495 Sick sinus syndrome: Secondary | ICD-10-CM | POA: Diagnosis not present

## 2021-09-30 DIAGNOSIS — I499 Cardiac arrhythmia, unspecified: Secondary | ICD-10-CM | POA: Diagnosis not present

## 2021-09-30 DIAGNOSIS — I4891 Unspecified atrial fibrillation: Secondary | ICD-10-CM | POA: Diagnosis not present

## 2021-09-30 DIAGNOSIS — I480 Paroxysmal atrial fibrillation: Secondary | ICD-10-CM | POA: Diagnosis not present

## 2021-09-30 DIAGNOSIS — I495 Sick sinus syndrome: Secondary | ICD-10-CM | POA: Diagnosis not present

## 2021-10-24 ENCOUNTER — Inpatient Hospital Stay: Payer: Medicare Other | Attending: Oncology | Admitting: Hematology and Oncology

## 2021-10-24 ENCOUNTER — Encounter: Payer: Self-pay | Admitting: Hematology and Oncology

## 2021-10-24 ENCOUNTER — Inpatient Hospital Stay: Payer: Medicare Other

## 2021-10-24 DIAGNOSIS — C4372 Malignant melanoma of left lower limb, including hip: Secondary | ICD-10-CM | POA: Diagnosis not present

## 2021-10-24 DIAGNOSIS — I890 Lymphedema, not elsewhere classified: Secondary | ICD-10-CM

## 2021-10-24 DIAGNOSIS — D649 Anemia, unspecified: Secondary | ICD-10-CM | POA: Diagnosis not present

## 2021-10-24 DIAGNOSIS — C774 Secondary and unspecified malignant neoplasm of inguinal and lower limb lymph nodes: Secondary | ICD-10-CM | POA: Diagnosis not present

## 2021-10-24 HISTORY — DX: Lymphedema, not elsewhere classified: I89.0

## 2021-10-24 LAB — BASIC METABOLIC PANEL
BUN: 20 (ref 4–21)
CO2: 24 — AB (ref 13–22)
Chloride: 108 (ref 99–108)
Creatinine: 0.8 (ref 0.5–1.1)
Glucose: 101
Potassium: 4.2 mEq/L (ref 3.5–5.1)
Sodium: 137 (ref 137–147)

## 2021-10-24 LAB — CBC AND DIFFERENTIAL
HCT: 39 (ref 36–46)
Hemoglobin: 13.1 (ref 12.0–16.0)
Neutrophils Absolute: 3.07
Platelets: 224 10*3/uL (ref 150–400)
WBC: 5.3

## 2021-10-24 LAB — COMPREHENSIVE METABOLIC PANEL
Albumin: 4 (ref 3.5–5.0)
Calcium: 9.2 (ref 8.7–10.7)

## 2021-10-24 LAB — CBC: RBC: 4.18 (ref 3.87–5.11)

## 2021-10-24 LAB — HEPATIC FUNCTION PANEL
ALT: 15 U/L (ref 7–35)
AST: 20 (ref 13–35)
Alkaline Phosphatase: 31 (ref 25–125)
Bilirubin, Total: 0.7

## 2021-10-24 NOTE — Assessment & Plan Note (Addendum)
History of stage IIA malignant melanoma of the left lower extremity in 1992.  She has had 2 incidences of recurrence.

## 2021-10-24 NOTE — Assessment & Plan Note (Signed)
Longstanding lymphedema of the left lower extremity due to previous inguinal lymph node surgery.  She uses a specialized pressurized leg brace.  This is stable.

## 2021-10-24 NOTE — Progress Notes (Signed)
[facility]  [address] 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  She remains without evidence of recurrence.  We will plan to see her back in 6 months with a CBC, comprehensive metabolic panel and chest x-ray for continued observation.  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  She has had 2 incidences of recurrence.  Lymphedema of left leg Longstanding lymphedema of the left lower extremity due to previous inguinal lymph node surgery.  She uses a specialized pressurized leg brace.  This is stable.    The patient understands the plans discussed today and is in agreement with them.  She knows to contact our office if she develops concerns prior to her next appointment.   I provided 15 minutes of face-to-face time during this encounter and > 50% was spent counseling as documented under my assessment and plan.    Marvia Pickles, PA-C  [facility] [address] [place] [address] Dept: [phone] Dept Fax: [phone]   Orders Placed This Encounter  Procedures   CT Chest W Contrast    Standing Status:   Future    Standing Expiration Date:   10/24/2022    Order Specific Question:   If indicated for the ordered procedure, I authorize the administration of contrast media per Radiology protocol    Answer:   Yes    Order Specific Question:   Preferred imaging location?    Answer:   External   CBC with Differential (Cancer Center Only)    Standing Status:   Future     Standing Expiration Date:   10/25/2022   CMP ([facility] only)    Standing Status:   Future    Standing Expiration Date:   10/25/2022   CBC and differential    This external order was created through the Results Console.   CBC    This external order was created through the Results Console.   Basic metabolic panel    This external order was created through the Results Console.   Comprehensive metabolic panel    This external order was created through the Results Console.   Hepatic function panel    This external order was created through the Results Console.      CHIEF COMPLAINT:  CC: History of stage IIA malignant melanoma  Current Treatment: Surveillance  HISTORY OF PRESENT ILLNESS:  Latasha Wells is a 81 y.o. female with a history of stage IIA malignant melanoma originally diagnosed in 64.  She had a Clark's level II with a Breslow thickness of 7.7 mm, treated with wide excision.  In 1997, she had a recurrence in the left groin and underwent node dissection, followed by 1 year of alpha interferon with Consuello Masse, MD at [facility].  In 1998, when she had just 1 week remaining on the interferon therapy, she developed a recurrence in the left groin again.  She then saw Willadean Carol, MD in [place] and underwent a deep node dissection of the left groin, followed by experimental immune therapy.  She has not had any evidence of recurrence since that time.  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  She sees her dermatologist, Dr. Altamese Cabal, in [facility], regularly. Ultrasound of the abdomen limited right upper quadrant from November 2020 was clear other than suggestion of mild hepatic steatosis.  Bone density scan in November 2021 was normal.  INTERVAL HISTORY:  Latasha Wells is here today for repeat clinical assessment.  She has been diagnosed with atrial fibrillation.  She underwent cardioversion, which was not completely effective.  She  is planned for pacemaker/defibrillator placement in November.  She reports occasional tachycardia with associated shortness of breath, but denies palpitations. She denies fevers or chills. She denies pain. Her appetite is good. Her weight has decreased 6 pounds over last 6 months .  She had a bilateral diagnostic mammogram and right breast ultrasound in May after reporting mass in the right breast.  This revealed multiple right breast benign lipomas including mass felt by the patient.  There is no evidence of malignancy.  Bilateral screening mammogram in 1 year was recommended.     REVIEW OF SYSTEMS:  Review of Systems  Constitutional:  Negative for appetite change, chills, fatigue, fever and unexpected weight change.  HENT:   Negative for lump/mass, mouth sores and sore throat.   Respiratory:  Positive for shortness of breath. Negative for cough.   Cardiovascular:  Positive for leg swelling. Negative for chest pain and palpitations.  Gastrointestinal:  Negative for abdominal pain, constipation, diarrhea, nausea and vomiting.  Endocrine: Negative for hot flashes.  Genitourinary:  Negative for difficulty urinating, dysuria, frequency and hematuria.   Musculoskeletal:  Negative for arthralgias, back pain and myalgias.  Skin:  Negative for rash.  Neurological:  Negative for dizziness and headaches.  Hematological:  Negative for adenopathy. Does not bruise/bleed easily.  Psychiatric/Behavioral:  Negative for depression and sleep disturbance. The patient is not nervous/anxious.      VITALS:  Blood pressure (!) 169/76, pulse (!) 56, temperature 97.6 F (36.4 C), temperature source Oral, resp. rate 18, height '5\' 1"'$  (1.549 m), weight 193 lb 14.4 oz (88 kg), SpO2 95 %.  Wt Readings from Last 3 Encounters:  10/24/21 193 lb 14.4 oz (88 kg)  04/24/21 199 lb (90.3 kg)  05/30/20 195 lb 4 oz (88.6 kg)    Body mass index is 36.64 kg/m.  Performance status (ECOG): 1 - Symptomatic but completely  ambulatory  PHYSICAL EXAM:  Physical Exam Vitals and nursing note reviewed.  Constitutional:      General: She is not in acute distress.    Appearance: Normal appearance.  HENT:     Head: Normocephalic and atraumatic.     Mouth/Throat:     Mouth: Mucous membranes are moist.     Pharynx: Oropharynx is clear. No oropharyngeal exudate or posterior oropharyngeal erythema.  Eyes:     General: No scleral icterus.    Extraocular Movements: Extraocular movements intact.     Conjunctiva/sclera: Conjunctivae normal.     Pupils: Pupils are equal, round, and reactive to light.  Cardiovascular:     Rate and Rhythm: Normal rate and regular rhythm.     Heart sounds: Normal heart sounds. No murmur heard.    No friction rub. No gallop.  Pulmonary:     Effort: Pulmonary effort is normal.     Breath sounds: Normal breath sounds. No wheezing, rhonchi or rales.  Abdominal:     General: There is no distension.     Palpations: Abdomen  is soft. There is no hepatomegaly, splenomegaly or mass.     Tenderness: There is no abdominal tenderness.  Musculoskeletal:        General: Normal range of motion.     Cervical back: Normal range of motion and neck supple. No tenderness.     Right lower leg: Edema (1+) present.     Left lower leg: Edema (2+) present.  Lymphadenopathy:     Cervical: No cervical adenopathy.     Upper Body:     Right upper body: No supraclavicular or axillary adenopathy.     Left upper body: No supraclavicular or axillary adenopathy.     Lower Body: No right inguinal adenopathy. No left inguinal adenopathy.  Skin:    General: Skin is warm and dry.     Coloration: Skin is not jaundiced.     Findings: No rash.  Neurological:     Mental Status: She is alert and oriented to person, place, and time.     Cranial Nerves: No cranial nerve deficit.  Psychiatric:        Mood and Affect: Mood normal.        Behavior: Behavior normal.        Thought Content: Thought content normal.      LABS:      Latest Ref Rng & Units 10/24/2021   12:00 AM 04/24/2021   12:00 AM 05/28/2020   12:00 AM  CBC  WBC  5.3     6.6     6.3   Hemoglobin 12.0 - 16.0 13.1     13.8     14.2   Hematocrit 36 - 46 39     42     43   Platelets 150 - 400 K/uL 224     257     240      This result is from an external source.      Latest Ref Rng & Units 10/24/2021   12:00 AM 04/24/2021   12:00 AM 05/28/2020   12:00 AM  CMP  BUN 4 - '21 20     22     22   '$ Creatinine 0.5 - 1.1 0.8     1.0     0.8   Sodium 137 - 147 137     142     136   Potassium 3.5 - 5.1 mEq/L 4.2     4.7     4.1   Chloride 99 - 108 108     107     106   CO2 13 - '22 24     25     24   '$ Calcium 8.7 - 10.7 9.2     9.3     8.6   Alkaline Phos 25 - 125 31     59     66   AST 13 - 35 '20     20     18   '$ ALT 7 - 35 U/L '15     17     12      '$ This result is from an external source.     No results found for: "CEA1", "CEA" / No results found for: "CEA1", "CEA" No results found for: "PSA1" No results found for: "GBE010" No results found for: "CAN125"  No results found for: "TOTALPROTELP", "ALBUMINELP", "A1GS", "A2GS", "BETS", "BETA2SER", "GAMS", "MSPIKE", "SPEI" No results found for: "TIBC", "FERRITIN", "IRONPCTSAT" No results found for: "LDH"  STUDIES:   Patient Name: KEIANDRA, SULLENGER  Gillermina Hu Account # [id number]  Exam(s): [id number] MAM/MAM DIGITAL W/TOMO DIAG B  CLINICAL DATA: Small mass felt by the patient in the medial right  breast for the past month. She reports that this feels like lipomas  that she has elsewhere, especially her arms.   EXAM:  DIGITAL DIAGNOSTIC BILATERAL MAMMOGRAM WITH TOMOSYNTHESIS AND CAD;  ULTRASOUND RIGHT BREAST  TECHNIQUE:  Bilateral digital diagnostic mammography and breast tomosynthesis  was performed. The images were evaluated with computer-aided  detection.; Targeted ultrasound examination of the right breast was  performed.   COMPARISON: Previous exam(s).  ACR Breast Density  Category b: There are scattered areas of  fibroglandular density.   FINDINGS:  Small, rounded area of fat density with a thin surrounding wall in  the medial right breast at the location of the mass felt by the  patient on the right, marked with a metallic marker. This is within  a small area of ill-defined increased density with some interspersed  fat.  There is also an oval, fat density mass with a thin surrounding  capsule at that location, measuring 2.3 x 1.1 cm on the spot  tangential images of the palpable mass.  No findings elsewhere in either breast suspicious for malignancy.  On physical exam, the patient has an approximately 2 x 1 cm oval,  circumscribed, mobile, mildly compressible mass in the 2:30 o'clock  position of the right breast, 9 cm from the nipple.  Targeted ultrasound is performed, showing a 2.0 x 2.0 x 0.8 cm oval,  horizontally oriented, circumscribed mass in the subcutaneous fat in  the 2:30 o'clock position of the right breast, 9 cm from the nipple.  This is mildly heterogeneous.   Courtesy Copy to: Diagnostic Imaging Report   A nearby 6 x 5 x 3 mm similar-appearing mass is demonstrated in the  2 o'clock position of the right breast, 8 cm from the.  There is also a nearby 1.3 x 0.9 x 0.6 cm similar-appearing mass in  the 2 o'clock position of the right breast, 7 cm from the nipple.   IMPRESSION:  1. Multiple right breast benign lipomas, including the mass felt by  the patient.  2. No evidence of malignancy in either breast.   RECOMMENDATION:  Bilateral screening mammogram in 1 year.  I have discussed the findings and recommendations with the patient.  If applicable, a reminder letter will be sent to the patient  regarding the next appointment.  BI-RADS CATEGORY 2: Benign.   HISTORY:   Past Medical History:  Diagnosis Date   Atrial fibrillation ([place])    Cancer ([place])    melanoma of lymph system   Complication of anesthesia    "hard to wake up",  reaction to med ('wake up shot")   H/O blood clots 1999   left leg behind knee   Hypertension    Lymphedema of left leg 10/24/2021   Lymphedema of leg    left leg   PONV (postoperative nausea and vomiting)     Past Surgical History:  Procedure Laterality Date   HYSTEROSCOPY WITH D & C N/A 03/27/2014   Procedure: DILATATION AND CURETTAGE /HYSTEROSCOPY, POLYPECTOMY;  Surgeon: Marylynn Pearson, MD;  Location: [facility];  Service: Gynecology;  Laterality: N/A;   MELANOMA EXCISION     x 2   TUBAL LIGATION     VULVA /PERINEUM BIOPSY N/A 03/27/2014   Procedure: LEFT VULVAR BIOPSY;  Surgeon: Marylynn Pearson, MD;  Location: [facility];  Service: Gynecology;  Laterality: N/A;  Family History  Problem Relation Age of Onset   Liver cancer Mother    Breast cancer Paternal Aunt     Social History:  reports that she has never smoked. She has never used smokeless tobacco. She reports that she does not drink alcohol and does not use drugs.The patient is alone today.  Allergies:  Allergies  Allergen Reactions   Codeine Nausea And Vomiting and Other (See Comments)   Other Other (See Comments)    hycosmine   Hydrocodone Other (See Comments)    Seeing double   Misc. Sulfonamide Containing Compounds Hives    Family history, prefers not to take   Sulfa Antibiotics Other (See Comments)    Family history, prefers not to take   Amoxicillin Rash and Hives    Current Medications: Current Outpatient Medications  Medication Sig Dispense Refill   benazepril (LOTENSIN) 40 MG tablet Take 1 tablet by mouth daily.     amLODipine (NORVASC) 10 MG tablet Take 10 mg by mouth daily.     calcium-vitamin D (OSCAL WITH D) 500-200 MG-UNIT per tablet Take 1 tablet by mouth daily with breakfast.     chlorthalidone (HYGROTON) 25 MG tablet Take 12.5 mg by mouth daily. (Patient not taking: Reported on 04/24/2021)     Cholecalciferol (VITAMIN D3 PO) Take 1 tablet by mouth daily.     cloNIDine (CATAPRES) 0.1 MG tablet Take  0.1 mg by mouth 2 (two) times daily.     diltiazem (DILACOR XR) 120 MG 24 hr capsule Take 120 mg by mouth daily.     ELIQUIS 5 MG TABS tablet Take 5 mg by mouth 2 (two) times daily.     flecainide (TAMBOCOR) 50 MG tablet Take 50 mg by mouth 2 (two) times daily.     Glucosamine HCl (GLUCOSAMINE PO) Glucosamine     ketoconazole (NIZORAL) 2 % cream APPLY DAILY TO RASH AS DIRECTED.     MAGNESIUM PO magnesium     mupirocin ointment (BACTROBAN) 2 % Apply 1 Application topically 3 (three) times daily.     NON FORMULARY Mushroom herbal supplement     Omega-3 Fatty Acids (FISH OIL) 1000 MG CAPS Fish Oil 1,000 mg (120 mg-180 mg) capsule  Take by oral route.     potassium chloride SA (KLOR-CON M) 20 MEQ tablet Take 1 tablet by mouth 2 (two) times daily.     TART CHERRY PO Tart Cherry     triamcinolone cream (KENALOG) 0.1 % Apply topically.     VITAMIN E PO Take 1 capsule by mouth daily.     No current facility-administered medications for this visit.

## 2021-10-24 NOTE — Assessment & Plan Note (Addendum)
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  She remains without evidence of recurrence.  We will plan to see her back in 6 months with a CBC, comprehensive metabolic panel and chest x-ray for continued observation.

## 2021-11-13 DIAGNOSIS — I10 Essential (primary) hypertension: Secondary | ICD-10-CM | POA: Diagnosis not present

## 2021-11-13 DIAGNOSIS — Z7901 Long term (current) use of anticoagulants: Secondary | ICD-10-CM | POA: Diagnosis not present

## 2021-11-13 DIAGNOSIS — I4891 Unspecified atrial fibrillation: Secondary | ICD-10-CM | POA: Diagnosis not present

## 2021-11-13 DIAGNOSIS — I495 Sick sinus syndrome: Secondary | ICD-10-CM | POA: Diagnosis not present

## 2021-11-13 DIAGNOSIS — I517 Cardiomegaly: Secondary | ICD-10-CM | POA: Diagnosis not present

## 2021-11-13 DIAGNOSIS — Z6835 Body mass index (BMI) 35.0-35.9, adult: Secondary | ICD-10-CM | POA: Diagnosis not present

## 2021-11-13 DIAGNOSIS — I700 Atherosclerosis of aorta: Secondary | ICD-10-CM | POA: Diagnosis not present

## 2021-11-13 DIAGNOSIS — Z950 Presence of cardiac pacemaker: Secondary | ICD-10-CM | POA: Diagnosis not present

## 2021-11-13 DIAGNOSIS — Z79899 Other long term (current) drug therapy: Secondary | ICD-10-CM | POA: Diagnosis not present

## 2021-11-13 DIAGNOSIS — I4819 Other persistent atrial fibrillation: Secondary | ICD-10-CM | POA: Diagnosis not present

## 2021-11-14 DIAGNOSIS — I495 Sick sinus syndrome: Secondary | ICD-10-CM | POA: Diagnosis not present

## 2021-11-14 DIAGNOSIS — I4819 Other persistent atrial fibrillation: Secondary | ICD-10-CM | POA: Diagnosis not present

## 2021-11-14 DIAGNOSIS — I10 Essential (primary) hypertension: Secondary | ICD-10-CM | POA: Diagnosis not present

## 2021-11-14 DIAGNOSIS — Z6835 Body mass index (BMI) 35.0-35.9, adult: Secondary | ICD-10-CM | POA: Diagnosis not present

## 2021-11-14 DIAGNOSIS — Z950 Presence of cardiac pacemaker: Secondary | ICD-10-CM | POA: Diagnosis not present

## 2021-11-14 DIAGNOSIS — I517 Cardiomegaly: Secondary | ICD-10-CM | POA: Diagnosis not present

## 2021-11-14 DIAGNOSIS — Z79899 Other long term (current) drug therapy: Secondary | ICD-10-CM | POA: Diagnosis not present

## 2021-11-24 DIAGNOSIS — Z950 Presence of cardiac pacemaker: Secondary | ICD-10-CM | POA: Diagnosis not present

## 2021-11-24 DIAGNOSIS — I4891 Unspecified atrial fibrillation: Secondary | ICD-10-CM | POA: Diagnosis not present

## 2021-11-24 DIAGNOSIS — Z45018 Encounter for adjustment and management of other part of cardiac pacemaker: Secondary | ICD-10-CM | POA: Diagnosis not present

## 2021-11-24 DIAGNOSIS — M5134 Other intervertebral disc degeneration, thoracic region: Secondary | ICD-10-CM | POA: Diagnosis not present

## 2021-11-24 DIAGNOSIS — I700 Atherosclerosis of aorta: Secondary | ICD-10-CM | POA: Diagnosis not present

## 2021-11-24 DIAGNOSIS — I517 Cardiomegaly: Secondary | ICD-10-CM | POA: Diagnosis not present

## 2021-12-12 DIAGNOSIS — I495 Sick sinus syndrome: Secondary | ICD-10-CM | POA: Diagnosis not present

## 2021-12-12 DIAGNOSIS — Z45018 Encounter for adjustment and management of other part of cardiac pacemaker: Secondary | ICD-10-CM | POA: Diagnosis not present

## 2022-02-03 DIAGNOSIS — H903 Sensorineural hearing loss, bilateral: Secondary | ICD-10-CM | POA: Diagnosis not present

## 2022-02-03 DIAGNOSIS — H6123 Impacted cerumen, bilateral: Secondary | ICD-10-CM | POA: Diagnosis not present

## 2022-02-12 DIAGNOSIS — I495 Sick sinus syndrome: Secondary | ICD-10-CM | POA: Diagnosis not present

## 2022-02-12 DIAGNOSIS — Z7901 Long term (current) use of anticoagulants: Secondary | ICD-10-CM | POA: Diagnosis not present

## 2022-02-12 DIAGNOSIS — I4819 Other persistent atrial fibrillation: Secondary | ICD-10-CM | POA: Diagnosis not present

## 2022-02-12 DIAGNOSIS — M79622 Pain in left upper arm: Secondary | ICD-10-CM | POA: Diagnosis not present

## 2022-02-12 DIAGNOSIS — Z950 Presence of cardiac pacemaker: Secondary | ICD-10-CM | POA: Diagnosis not present

## 2022-02-13 DIAGNOSIS — I495 Sick sinus syndrome: Secondary | ICD-10-CM | POA: Diagnosis not present

## 2022-02-13 DIAGNOSIS — I4891 Unspecified atrial fibrillation: Secondary | ICD-10-CM | POA: Diagnosis not present

## 2022-02-13 DIAGNOSIS — Z45018 Encounter for adjustment and management of other part of cardiac pacemaker: Secondary | ICD-10-CM | POA: Diagnosis not present

## 2022-02-19 DIAGNOSIS — M79622 Pain in left upper arm: Secondary | ICD-10-CM | POA: Diagnosis not present

## 2022-02-26 DIAGNOSIS — I728 Aneurysm of other specified arteries: Secondary | ICD-10-CM | POA: Diagnosis present

## 2022-02-26 DIAGNOSIS — M722 Plantar fascial fibromatosis: Secondary | ICD-10-CM | POA: Diagnosis present

## 2022-02-26 DIAGNOSIS — M19072 Primary osteoarthritis, left ankle and foot: Secondary | ICD-10-CM | POA: Diagnosis not present

## 2022-02-26 DIAGNOSIS — D6859 Other primary thrombophilia: Secondary | ICD-10-CM | POA: Diagnosis present

## 2022-02-26 DIAGNOSIS — E876 Hypokalemia: Secondary | ICD-10-CM | POA: Diagnosis not present

## 2022-02-26 DIAGNOSIS — E279 Disorder of adrenal gland, unspecified: Secondary | ICD-10-CM | POA: Diagnosis present

## 2022-02-26 DIAGNOSIS — K621 Rectal polyp: Secondary | ICD-10-CM | POA: Diagnosis not present

## 2022-02-26 DIAGNOSIS — Z7901 Long term (current) use of anticoagulants: Secondary | ICD-10-CM | POA: Diagnosis not present

## 2022-02-26 DIAGNOSIS — R933 Abnormal findings on diagnostic imaging of other parts of digestive tract: Secondary | ICD-10-CM | POA: Diagnosis not present

## 2022-02-26 DIAGNOSIS — D369 Benign neoplasm, unspecified site: Secondary | ICD-10-CM | POA: Diagnosis not present

## 2022-02-26 DIAGNOSIS — M25572 Pain in left ankle and joints of left foot: Secondary | ICD-10-CM | POA: Diagnosis not present

## 2022-02-26 DIAGNOSIS — E669 Obesity, unspecified: Secondary | ICD-10-CM | POA: Diagnosis present

## 2022-02-26 DIAGNOSIS — M19079 Primary osteoarthritis, unspecified ankle and foot: Secondary | ICD-10-CM | POA: Diagnosis present

## 2022-02-26 DIAGNOSIS — K5730 Diverticulosis of large intestine without perforation or abscess without bleeding: Secondary | ICD-10-CM | POA: Diagnosis not present

## 2022-02-26 DIAGNOSIS — N390 Urinary tract infection, site not specified: Secondary | ICD-10-CM | POA: Diagnosis not present

## 2022-02-26 DIAGNOSIS — D6832 Hemorrhagic disorder due to extrinsic circulating anticoagulants: Secondary | ICD-10-CM | POA: Diagnosis present

## 2022-02-26 DIAGNOSIS — K558 Other vascular disorders of intestine: Secondary | ICD-10-CM | POA: Diagnosis present

## 2022-02-26 DIAGNOSIS — Z950 Presence of cardiac pacemaker: Secondary | ICD-10-CM | POA: Diagnosis not present

## 2022-02-26 DIAGNOSIS — Z880 Allergy status to penicillin: Secondary | ICD-10-CM | POA: Diagnosis not present

## 2022-02-26 DIAGNOSIS — R319 Hematuria, unspecified: Secondary | ICD-10-CM | POA: Diagnosis not present

## 2022-02-26 DIAGNOSIS — I10 Essential (primary) hypertension: Secondary | ICD-10-CM | POA: Diagnosis present

## 2022-02-26 DIAGNOSIS — K625 Hemorrhage of anus and rectum: Secondary | ICD-10-CM | POA: Diagnosis not present

## 2022-02-26 DIAGNOSIS — I4891 Unspecified atrial fibrillation: Secondary | ICD-10-CM | POA: Diagnosis not present

## 2022-02-26 DIAGNOSIS — Z85820 Personal history of malignant melanoma of skin: Secondary | ICD-10-CM | POA: Diagnosis not present

## 2022-02-26 DIAGNOSIS — K6389 Other specified diseases of intestine: Secondary | ICD-10-CM | POA: Diagnosis not present

## 2022-02-26 DIAGNOSIS — K635 Polyp of colon: Secondary | ICD-10-CM | POA: Diagnosis not present

## 2022-02-26 DIAGNOSIS — T45515A Adverse effect of anticoagulants, initial encounter: Secondary | ICD-10-CM | POA: Diagnosis present

## 2022-02-26 DIAGNOSIS — R8290 Unspecified abnormal findings in urine: Secondary | ICD-10-CM | POA: Diagnosis not present

## 2022-02-26 DIAGNOSIS — Z882 Allergy status to sulfonamides status: Secondary | ICD-10-CM | POA: Diagnosis not present

## 2022-02-26 DIAGNOSIS — K921 Melena: Secondary | ICD-10-CM | POA: Diagnosis not present

## 2022-02-26 DIAGNOSIS — K644 Residual hemorrhoidal skin tags: Secondary | ICD-10-CM | POA: Diagnosis present

## 2022-02-26 DIAGNOSIS — K529 Noninfective gastroenteritis and colitis, unspecified: Secondary | ICD-10-CM | POA: Diagnosis not present

## 2022-02-26 DIAGNOSIS — I482 Chronic atrial fibrillation, unspecified: Secondary | ICD-10-CM | POA: Diagnosis present

## 2022-02-26 DIAGNOSIS — R1030 Lower abdominal pain, unspecified: Secondary | ICD-10-CM | POA: Diagnosis not present

## 2022-02-26 DIAGNOSIS — K55039 Acute (reversible) ischemia of large intestine, extent unspecified: Secondary | ICD-10-CM | POA: Diagnosis not present

## 2022-02-26 DIAGNOSIS — D120 Benign neoplasm of cecum: Secondary | ICD-10-CM | POA: Diagnosis not present

## 2022-02-26 DIAGNOSIS — Z6835 Body mass index (BMI) 35.0-35.9, adult: Secondary | ICD-10-CM | POA: Diagnosis not present

## 2022-02-26 DIAGNOSIS — R109 Unspecified abdominal pain: Secondary | ICD-10-CM | POA: Diagnosis not present

## 2022-02-26 DIAGNOSIS — D128 Benign neoplasm of rectum: Secondary | ICD-10-CM | POA: Diagnosis present

## 2022-02-27 DIAGNOSIS — M25572 Pain in left ankle and joints of left foot: Secondary | ICD-10-CM | POA: Diagnosis not present

## 2022-02-27 DIAGNOSIS — M19079 Primary osteoarthritis, unspecified ankle and foot: Secondary | ICD-10-CM | POA: Diagnosis present

## 2022-02-27 DIAGNOSIS — D120 Benign neoplasm of cecum: Secondary | ICD-10-CM | POA: Diagnosis present

## 2022-02-27 DIAGNOSIS — D6832 Hemorrhagic disorder due to extrinsic circulating anticoagulants: Secondary | ICD-10-CM | POA: Diagnosis present

## 2022-02-27 DIAGNOSIS — T45515A Adverse effect of anticoagulants, initial encounter: Secondary | ICD-10-CM | POA: Diagnosis present

## 2022-02-27 DIAGNOSIS — M722 Plantar fascial fibromatosis: Secondary | ICD-10-CM | POA: Diagnosis present

## 2022-02-27 DIAGNOSIS — R933 Abnormal findings on diagnostic imaging of other parts of digestive tract: Secondary | ICD-10-CM | POA: Diagnosis not present

## 2022-02-27 DIAGNOSIS — R109 Unspecified abdominal pain: Secondary | ICD-10-CM | POA: Diagnosis not present

## 2022-02-27 DIAGNOSIS — I4891 Unspecified atrial fibrillation: Secondary | ICD-10-CM | POA: Diagnosis not present

## 2022-02-27 DIAGNOSIS — K625 Hemorrhage of anus and rectum: Secondary | ICD-10-CM | POA: Diagnosis not present

## 2022-02-27 DIAGNOSIS — K6389 Other specified diseases of intestine: Secondary | ICD-10-CM | POA: Diagnosis not present

## 2022-02-27 DIAGNOSIS — D369 Benign neoplasm, unspecified site: Secondary | ICD-10-CM | POA: Diagnosis not present

## 2022-02-27 DIAGNOSIS — K621 Rectal polyp: Secondary | ICD-10-CM | POA: Diagnosis not present

## 2022-02-27 DIAGNOSIS — E279 Disorder of adrenal gland, unspecified: Secondary | ICD-10-CM | POA: Diagnosis present

## 2022-02-27 DIAGNOSIS — Z7901 Long term (current) use of anticoagulants: Secondary | ICD-10-CM | POA: Diagnosis not present

## 2022-02-27 DIAGNOSIS — D6859 Other primary thrombophilia: Secondary | ICD-10-CM | POA: Diagnosis present

## 2022-02-27 DIAGNOSIS — K529 Noninfective gastroenteritis and colitis, unspecified: Secondary | ICD-10-CM | POA: Diagnosis not present

## 2022-02-27 DIAGNOSIS — Z882 Allergy status to sulfonamides status: Secondary | ICD-10-CM | POA: Diagnosis not present

## 2022-02-27 DIAGNOSIS — Z880 Allergy status to penicillin: Secondary | ICD-10-CM | POA: Diagnosis not present

## 2022-02-27 DIAGNOSIS — D128 Benign neoplasm of rectum: Secondary | ICD-10-CM | POA: Diagnosis present

## 2022-02-27 DIAGNOSIS — K55039 Acute (reversible) ischemia of large intestine, extent unspecified: Secondary | ICD-10-CM | POA: Diagnosis not present

## 2022-02-27 DIAGNOSIS — K644 Residual hemorrhoidal skin tags: Secondary | ICD-10-CM | POA: Diagnosis present

## 2022-02-27 DIAGNOSIS — E669 Obesity, unspecified: Secondary | ICD-10-CM | POA: Diagnosis present

## 2022-02-27 DIAGNOSIS — Z950 Presence of cardiac pacemaker: Secondary | ICD-10-CM | POA: Diagnosis not present

## 2022-02-27 DIAGNOSIS — M19072 Primary osteoarthritis, left ankle and foot: Secondary | ICD-10-CM | POA: Diagnosis not present

## 2022-02-27 DIAGNOSIS — K5730 Diverticulosis of large intestine without perforation or abscess without bleeding: Secondary | ICD-10-CM | POA: Diagnosis present

## 2022-02-27 DIAGNOSIS — I728 Aneurysm of other specified arteries: Secondary | ICD-10-CM | POA: Diagnosis present

## 2022-02-27 DIAGNOSIS — Z85820 Personal history of malignant melanoma of skin: Secondary | ICD-10-CM | POA: Diagnosis not present

## 2022-02-27 DIAGNOSIS — K921 Melena: Secondary | ICD-10-CM | POA: Diagnosis not present

## 2022-02-27 DIAGNOSIS — K558 Other vascular disorders of intestine: Secondary | ICD-10-CM | POA: Diagnosis present

## 2022-02-27 DIAGNOSIS — E876 Hypokalemia: Secondary | ICD-10-CM | POA: Diagnosis present

## 2022-02-27 DIAGNOSIS — I482 Chronic atrial fibrillation, unspecified: Secondary | ICD-10-CM | POA: Diagnosis present

## 2022-02-27 DIAGNOSIS — Z6835 Body mass index (BMI) 35.0-35.9, adult: Secondary | ICD-10-CM | POA: Diagnosis not present

## 2022-02-27 DIAGNOSIS — K635 Polyp of colon: Secondary | ICD-10-CM | POA: Diagnosis not present

## 2022-02-27 DIAGNOSIS — I10 Essential (primary) hypertension: Secondary | ICD-10-CM | POA: Diagnosis present

## 2022-03-04 DIAGNOSIS — M79672 Pain in left foot: Secondary | ICD-10-CM | POA: Diagnosis not present

## 2022-03-10 DIAGNOSIS — Z950 Presence of cardiac pacemaker: Secondary | ICD-10-CM | POA: Diagnosis not present

## 2022-03-10 DIAGNOSIS — I10 Essential (primary) hypertension: Secondary | ICD-10-CM | POA: Diagnosis not present

## 2022-03-10 DIAGNOSIS — R0609 Other forms of dyspnea: Secondary | ICD-10-CM | POA: Diagnosis not present

## 2022-03-10 DIAGNOSIS — I4819 Other persistent atrial fibrillation: Secondary | ICD-10-CM | POA: Diagnosis not present

## 2022-03-10 DIAGNOSIS — I4891 Unspecified atrial fibrillation: Secondary | ICD-10-CM | POA: Diagnosis not present

## 2022-03-10 DIAGNOSIS — Z7901 Long term (current) use of anticoagulants: Secondary | ICD-10-CM | POA: Diagnosis not present

## 2022-03-10 DIAGNOSIS — R600 Localized edema: Secondary | ICD-10-CM | POA: Diagnosis not present

## 2022-03-10 DIAGNOSIS — K523 Indeterminate colitis: Secondary | ICD-10-CM | POA: Diagnosis not present

## 2022-03-10 DIAGNOSIS — M79622 Pain in left upper arm: Secondary | ICD-10-CM | POA: Diagnosis not present

## 2022-03-10 DIAGNOSIS — I495 Sick sinus syndrome: Secondary | ICD-10-CM | POA: Diagnosis not present

## 2022-03-10 DIAGNOSIS — D4412 Neoplasm of uncertain behavior of left adrenal gland: Secondary | ICD-10-CM | POA: Diagnosis not present

## 2022-03-10 DIAGNOSIS — M1000 Idiopathic gout, unspecified site: Secondary | ICD-10-CM | POA: Diagnosis not present

## 2022-04-01 ENCOUNTER — Telehealth: Payer: Self-pay | Admitting: Oncology

## 2022-04-01 ENCOUNTER — Other Ambulatory Visit: Payer: Self-pay | Admitting: Oncology

## 2022-04-01 DIAGNOSIS — C774 Secondary and unspecified malignant neoplasm of inguinal and lower limb lymph nodes: Secondary | ICD-10-CM

## 2022-04-01 NOTE — Telephone Encounter (Signed)
LVM notifying pt of below.    RE: Next Appts Received: Today Derwood Kaplan, MD sent to Mellody Drown; Belva Chimes, LPN; Corene Cornea I am so sorry for the confusion, please apologize to the pt.  Vida Roller saw her in October and her note says she will order CXR but put in order for CT.  I have canceled that and added order for the CXR.  I don't know of any reason to do CT unless chest xray is abnormal       Previous Messages    ----- Message ----- From: Mellody Drown Sent: 03/31/2022   4:05 PM EDT To: Derwood Kaplan, MD; Belva Chimes, LPN; * Subject: Next Appts                                    Hello,  I called pt to notify her of CT scan appt details but she was confused stating that no one has told her that she would need a CT scan. She did say we obtain a yearly Chest XR so I am just verifying to see what truly needs to be done for this pt.. I see an order for a chest xr that was given to the patient but then there is an order in [facility] requesting the actual CT.  Please advise,   Thanks,  Damaris F.

## 2022-04-14 DIAGNOSIS — I4821 Permanent atrial fibrillation: Secondary | ICD-10-CM | POA: Diagnosis not present

## 2022-04-14 DIAGNOSIS — I10 Essential (primary) hypertension: Secondary | ICD-10-CM | POA: Diagnosis not present

## 2022-04-14 DIAGNOSIS — Z6838 Body mass index (BMI) 38.0-38.9, adult: Secondary | ICD-10-CM | POA: Diagnosis not present

## 2022-04-14 DIAGNOSIS — M1000 Idiopathic gout, unspecified site: Secondary | ICD-10-CM | POA: Diagnosis not present

## 2022-04-23 NOTE — Progress Notes (Signed)
[facility]  [address] [place],  [place]  [address] [phone]  Clinic Day:  04/28/22  Referring physician: Alinda Deem, MD   She needs mammo  CHIEF COMPLAINT:  CC: History of stage IIA malignant melanoma with recurrence  Current Treatment:  Surveillance  HISTORY OF PRESENT ILLNESS:  Latasha Wells is a 82 y.o. female with a history of stage IIA malignant melanoma originally diagnosed in 70.  She had a Clark's level II with a Breslow thickness of 7.7 mm, treated with wide excision.  In 1997, she had a recurrence in the left groin and underwent node dissection, followed by 1 year of alpha interferon with Ivar Bury, MD at [facility].  In 1998, when she had just 1 week remaining on the interferon therapy, she developed a recurrence in the left groin again.  She then saw Theda Belfast, MD in [place] and underwent a deep node dissection of the left groin, followed by experimental immune therapy.  She has not had any evidence of recurrence since that time.  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  She sees her dermatologist, Dr. Stefanie Libel, in [facility], regularly.  She states she is up-to-date on screening colonoscopy.  Ultrasound of the abdomen limited right upper quadrant from November 2020 was clear other than suggestion of mild hepatic steatosis.  Bone density scan in November 2021 was normal.  INTERVAL HISTORY:  Latasha Wells is here for routine follow up for her history of stage IIA malignant melanoma, which recurred 5 years later. Patient states that she feels well and has no complaints of pain. 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 She does use compression hose due to her chronic lymphedema of the left leg. She had a chest x-ray done before her appointment with me today and it was clear. Patient inquired about having a lipid panel done today, as at her last PCP  appointment her cholesterol was high, but she did not fast that day. She informed me that she feels "out of her body", nauseous, and loss of appetite at times. She believes this may be a caused by her Statin. 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 Her labs today are pending and I will call her with those results. I will add a lipid panel to that also. She will be due for a annual screening mammogram after 05/17/2022 and I will get that scheduled and call her with the results. I will see her back in 6 months with CBC and CMP. She denies signs of infection such as sore throat, sinus drainage, cough, or urinary symptoms.  She denies fevers or recurrent chills. She denies pain. She denies nausea, vomiting, chest pain, dyspnea or cough. Her appetite is ok and her weight has increased 2 pounds over last 6 months .  REVIEW OF SYSTEMS:  Review of Systems  Constitutional: Negative.  Negative for appetite change, chills, diaphoresis, fatigue, fever and unexpected weight change.  HENT:  Negative.  Negative for hearing loss, lump/mass, mouth sores, nosebleeds, sore throat, tinnitus, trouble swallowing and voice change.   Eyes: Negative.  Negative for eye problems and icterus.  Respiratory: Negative.  Negative for chest tightness, cough, hemoptysis, shortness of breath and wheezing.   Cardiovascular:  Positive for leg swelling (chronic lymphedema of the left leg). Negative for chest pain and palpitations.  Gastrointestinal: Negative.  Negative for abdominal distention, abdominal pain,  blood in stool, constipation, diarrhea, nausea, rectal pain and vomiting.  Endocrine: Negative.   Genitourinary: Negative.  Negative for bladder incontinence, difficulty urinating, dyspareunia, dysuria, frequency, hematuria, menstrual problem, nocturia, pelvic pain, vaginal bleeding and vaginal discharge.   Musculoskeletal: Negative.  Negative for arthralgias, back pain, flank  pain, gait problem, myalgias, neck pain and neck stiffness.  Skin: Negative.  Negative for itching, rash and wound.  Neurological: Negative.  Negative for dizziness, extremity weakness, gait problem, headaches, light-headedness, numbness, seizures and speech difficulty.  Hematological: Negative.  Negative for adenopathy. Does not bruise/bleed easily.  Psychiatric/Behavioral: Negative.  Negative for confusion, decreased concentration, depression, sleep disturbance and suicidal ideas. The patient is not nervous/anxious.      VITALS:  Blood pressure 135/75, pulse 69, temperature 97.8 F (36.6 C), temperature source Oral, resp. rate 16, height 5\' 1"  (1.549 m), weight 195 lb 3.2 oz (88.5 kg), SpO2 97 %.  Wt Readings from Last 3 Encounters:  04/28/22 195 lb 3.2 oz (88.5 kg)  10/24/21 193 lb 14.4 oz (88 kg)  04/24/21 199 lb (90.3 kg)    Body mass index is 36.88 kg/m.  Performance status (ECOG): 1 - Symptomatic but completely ambulatory  PHYSICAL EXAM:  Physical Exam Vitals and nursing note reviewed.  Constitutional:      General: She is not in acute distress.    Appearance: Normal appearance. She is normal weight. She is not ill-appearing, toxic-appearing or diaphoretic.  HENT:     Head: Normocephalic and atraumatic.     Right Ear: Tympanic membrane, ear canal and external ear normal. There is no impacted cerumen.     Left Ear: Tympanic membrane, ear canal and external ear normal. There is no impacted cerumen.     Nose: Nose normal. No congestion or rhinorrhea.     Mouth/Throat:     Mouth: Mucous membranes are moist.     Pharynx: Oropharynx is clear. No oropharyngeal exudate or posterior oropharyngeal erythema.  Eyes:     General: No scleral icterus.       Right eye: No discharge.        Left eye: No discharge.     Extraocular Movements: Extraocular movements intact.     Conjunctiva/sclera: Conjunctivae normal.     Pupils: Pupils are equal, round, and reactive to light.  Neck:      Vascular: No carotid bruit.  Cardiovascular:     Rate and Rhythm: Normal rate and regular rhythm.     Pulses: Normal pulses.     Heart sounds: Normal heart sounds. No murmur heard.    No friction rub. No gallop.  Pulmonary:     Effort: Pulmonary effort is normal. No respiratory distress.     Breath sounds: Normal breath sounds. No stridor. No wheezing, rhonchi or rales.  Chest:     Chest wall: No tenderness.     Comments: Breast exam deferred.  Abdominal:     General: Bowel sounds are normal. There is no distension.     Palpations: Abdomen is soft. There is no mass.     Tenderness: There is no abdominal tenderness. There is no right CVA tenderness, left CVA tenderness, guarding or rebound.     Hernia: No hernia is present.  Musculoskeletal:        General: No swelling, tenderness, deformity or signs of injury. Normal range of motion.     Cervical back: Normal range of motion and neck supple. No rigidity or tenderness.     Right lower leg: No edema.  Left lower leg: Edema (minimal) present.     Comments: Firm scar in the left inguinal area.  She does have compression socks on at this time.   Lymphadenopathy:     Cervical: No cervical adenopathy.  Skin:    General: Skin is warm and dry.     Coloration: Skin is not jaundiced or pale.     Findings: No bruising, erythema, lesion or rash.  Neurological:     General: No focal deficit present.     Mental Status: She is alert and oriented to person, place, and time. Mental status is at baseline.     Cranial Nerves: No cranial nerve deficit.     Sensory: No sensory deficit.     Motor: No weakness.     Coordination: Coordination normal.     Gait: Gait normal.     Deep Tendon Reflexes: Reflexes normal.  Psychiatric:        Mood and Affect: Mood normal.        Behavior: Behavior normal.        Thought Content: Thought content normal.        Judgment: Judgment normal.     LABS:   Component Ref Range & Units 03/02/2022   WBC 3.6 - 11.2 10*9/L 7.9  RBC 3.95 - 5.13 10*12/L 3.84 Low   HGB 11.3 - 14.9 g/dL 16.1  HCT 09.6 - 04.5 % 34.9  MCV 77.6 - 95.7 fL 90.9  MCH 25.9 - 32.4 pg 31.3  MCHC 32.0 - 36.0 g/dL 40.9  RDW 81.1 - 91.4 % 13.4  MPV 6.8 - 10.7 fL 8.3  Platelet 150 - 450 10*9/L 205   Component Ref Range & Units 03/02/2022  Sodium 135 - 145 mmol/L 144  Potassium 3.4 - 4.8 mmol/L 3.5  Chloride 98 - 107 mmol/L 112 High   CO2 20.0 - 31.0 mmol/L 24.0  Anion Gap 5 - 14 mmol/L 8  BUN 9 - 23 mg/dL 9  Creatinine 7.82 - 9.56 mg/dL 2.13  BUN/Creatinine Ratio 12  eGFR CKD-EPI (2021) Female >=60 mL/min/1.34m2 82  Glucose 70 - 179 mg/dL 98  Calcium 8.7 - 08.6 mg/dL 8.4 Low    Component Ref Range & Units 03/02/2022  Magnesium 1.6 - 2.6 mg/dL 1.6      Latest Ref Rng & Units 04/28/2022   12:00 AM 10/24/2021   12:00 AM 04/24/2021   12:00 AM  CBC  WBC  7.3     5.3     6.6      Hemoglobin 12.0 - 16.0 13.8     13.1     13.8      Hematocrit 36 - 46 41     39     42      Platelets 150 - 400 K/uL 269     224     257         This result is from an external source.      Latest Ref Rng & Units 04/28/2022   12:00 AM 10/24/2021   12:00 AM 04/24/2021   12:00 AM  CMP  BUN 4 - 21 23     20     22       Creatinine 0.5 - 1.1 0.7     0.8     1.0      Sodium 137 - 147 140     137     142      Potassium 3.5 - 5.1 mEq/L 4.7  4.2     4.7      Chloride 99 - 108 108     108     107      CO2 13 - 22 24     24     25       Calcium 8.7 - 10.7 9.5     9.2     9.3      Alkaline Phos 25 - 125 68     31     59      AST 13 - 35 33     20     20      ALT 7 - 35 U/L 20     15     17          This result is from an external source.   STUDIES:     Allergies:  Allergies  Allergen Reactions   Codeine Nausea And Vomiting and Other (See Comments)   Diltiazem Other (See Comments) and Shortness Of Breath    Very weak   Other Other (See Comments)    hycosmine   Hydrocodone Other (See Comments)     Seeing double   Hydrocortisone    Misc. Sulfonamide Containing Compounds Hives    Family history, prefers not to take   Sulfa Antibiotics Other (See Comments)    Family history, prefers not to take   Amoxicillin Rash and Hives   Iohexol Itching and Rash    Current Medications: Current Outpatient Medications  Medication Sig Dispense Refill   amLODipine (NORVASC) 10 MG tablet Take 10 mg by mouth daily.     benazepril (LOTENSIN) 40 MG tablet Take 1 tablet by mouth daily.     calcium-vitamin D (OSCAL WITH D) 500-200 MG-UNIT per tablet Take 1 tablet by mouth daily with breakfast.     chlorthalidone (HYGROTON) 25 MG tablet Take 12.5 mg by mouth daily. (Patient not taking: Reported on 04/24/2021)     Cholecalciferol (VITAMIN D3 PO) Take 1 tablet by mouth daily.     cloNIDine (CATAPRES) 0.1 MG tablet Take 0.1 mg by mouth 2 (two) times daily.     diltiazem (DILACOR XR) 120 MG 24 hr capsule Take 120 mg by mouth daily.     ELIQUIS 5 MG TABS tablet Take 5 mg by mouth 2 (two) times daily.     flecainide (TAMBOCOR) 50 MG tablet Take 50 mg by mouth 2 (two) times daily.     Glucosamine HCl (GLUCOSAMINE PO) Glucosamine     ketoconazole (NIZORAL) 2 % cream APPLY DAILY TO RASH AS DIRECTED.     MAGNESIUM PO magnesium     mupirocin ointment (BACTROBAN) 2 % Apply 1 Application topically 3 (three) times daily.     NON FORMULARY Mushroom herbal supplement     Omega-3 Fatty Acids (FISH OIL) 1000 MG CAPS Fish Oil 1,000 mg (120 mg-180 mg) capsule  Take by oral route.     potassium chloride SA (KLOR-CON M) 20 MEQ tablet Take 1 tablet by mouth 2 (two) times daily.     TART CHERRY PO Tart Cherry     triamcinolone cream (KENALOG) 0.1 % Apply topically.     VITAMIN E PO Take 1 capsule by mouth daily.     No current facility-administered medications for this visit.    ASSESSMENT & PLAN:  Assessment:   1.  History of recurrent melanoma twice, originally diagnosed in 72. She has been disease free for many  years.  2.  Longstanding lymphedema of the lower extremity as a result of her prior treatment, but this remains stable.  3.  Occasional dyspnea with exertion.  Her chest x-ray is clear.    Plan: She had a pacemaker put in place in November, 2023. She does use compression hose due to her chronic lymphedema of the left leg. She had a chest x-ray done before her appointment with me today 04/28/2022 and it was clear. 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 Her labs today are pending and I will call her with those results. I will add a lipid panel to that also. She will be due for a annual screening mammogram after 05/17/2022 and I will get that scheduled and call her with the results. I will see her back in 6 months with CBC and CMP. She understands and agrees with this plan of care.  I provided 30 minutes of face-to-face time during this this encounter and > 50% was spent counseling as documented under my assessment and plan.    Dellia Beckwith, MD [facility] [address] [place] [address] Dept: [phone] Dept Fax: [phone]     Rulon Sera Lassiter,acting as a scribe for Dellia Beckwith, MD.,have documented all relevant documentation on the behalf of Dellia Beckwith, MD,as directed by  Dellia Beckwith, MD while in the presence of Dellia Beckwith, MD.

## 2022-04-27 ENCOUNTER — Ambulatory Visit: Payer: Medicare Other | Admitting: Oncology

## 2022-04-27 ENCOUNTER — Other Ambulatory Visit: Payer: Medicare Other

## 2022-04-28 ENCOUNTER — Telehealth: Payer: Self-pay | Admitting: Oncology

## 2022-04-28 ENCOUNTER — Inpatient Hospital Stay: Payer: Medicare Other

## 2022-04-28 ENCOUNTER — Other Ambulatory Visit: Payer: Self-pay | Admitting: Oncology

## 2022-04-28 ENCOUNTER — Encounter: Payer: Self-pay | Admitting: Oncology

## 2022-04-28 ENCOUNTER — Other Ambulatory Visit: Payer: Medicare Other

## 2022-04-28 ENCOUNTER — Inpatient Hospital Stay: Payer: Medicare Other | Attending: Oncology | Admitting: Oncology

## 2022-04-28 VITALS — BP 135/75 | HR 69 | Temp 97.8°F | Resp 16 | Ht 61.0 in | Wt 195.2 lb

## 2022-04-28 DIAGNOSIS — Z950 Presence of cardiac pacemaker: Secondary | ICD-10-CM | POA: Diagnosis not present

## 2022-04-28 DIAGNOSIS — C774 Secondary and unspecified malignant neoplasm of inguinal and lower limb lymph nodes: Secondary | ICD-10-CM

## 2022-04-28 DIAGNOSIS — C801 Malignant (primary) neoplasm, unspecified: Secondary | ICD-10-CM | POA: Diagnosis not present

## 2022-04-28 DIAGNOSIS — Z79899 Other long term (current) drug therapy: Secondary | ICD-10-CM | POA: Diagnosis not present

## 2022-04-28 DIAGNOSIS — R0600 Dyspnea, unspecified: Secondary | ICD-10-CM | POA: Diagnosis not present

## 2022-04-28 DIAGNOSIS — I700 Atherosclerosis of aorta: Secondary | ICD-10-CM | POA: Diagnosis not present

## 2022-04-28 DIAGNOSIS — C4372 Malignant melanoma of left lower limb, including hip: Secondary | ICD-10-CM

## 2022-04-28 DIAGNOSIS — Z85820 Personal history of malignant melanoma of skin: Secondary | ICD-10-CM | POA: Insufficient documentation

## 2022-04-28 DIAGNOSIS — Z1231 Encounter for screening mammogram for malignant neoplasm of breast: Secondary | ICD-10-CM

## 2022-04-28 DIAGNOSIS — Z1239 Encounter for other screening for malignant neoplasm of breast: Secondary | ICD-10-CM

## 2022-04-28 DIAGNOSIS — I890 Lymphedema, not elsewhere classified: Secondary | ICD-10-CM | POA: Insufficient documentation

## 2022-04-28 DIAGNOSIS — E785 Hyperlipidemia, unspecified: Secondary | ICD-10-CM

## 2022-04-28 DIAGNOSIS — I2510 Atherosclerotic heart disease of native coronary artery without angina pectoris: Secondary | ICD-10-CM | POA: Diagnosis not present

## 2022-04-28 LAB — LIPID PANEL
Cholesterol: 172 mg/dL (ref 0–200)
HDL: 75 mg/dL (ref >40)
LDL Cholesterol: 69 mg/dL (ref 0–99)
Total CHOL/HDL Ratio: 2.3 RATIO
Triglycerides: 141 mg/dL (ref <150)
VLDL: 28 mg/dL (ref 0–40)

## 2022-04-28 LAB — BASIC METABOLIC PANEL
BUN: 23 — AB (ref 4–21)
CO2: 24 — AB (ref 13–22)
Chloride: 108 (ref 99–108)
Creatinine: 0.7 (ref 0.5–1.1)
EGFR: 60
Glucose: 96
Potassium: 4.7 mEq/L (ref 3.5–5.1)
Sodium: 140 (ref 137–147)

## 2022-04-28 LAB — COMPREHENSIVE METABOLIC PANEL
Albumin: 4.3 (ref 3.5–5.0)
Calcium: 9.5 (ref 8.7–10.7)

## 2022-04-28 LAB — CBC AND DIFFERENTIAL
HCT: 41 (ref 36–46)
Hemoglobin: 13.8 (ref 12.0–16.0)
Neutrophils Absolute: 4.31
Platelets: 269 10*3/uL (ref 150–400)
WBC: 7.3

## 2022-04-28 LAB — HEPATIC FUNCTION PANEL
ALT: 20 U/L (ref 7–35)
AST: 33 (ref 13–35)
Alkaline Phosphatase: 68 (ref 25–125)
Bilirubin, Total: 0.5

## 2022-04-28 LAB — CBC: RBC: 4.52 (ref 3.87–5.11)

## 2022-04-28 NOTE — Telephone Encounter (Signed)
04/28/22 Next appt scheduled and confirmed with patient

## 2022-04-30 ENCOUNTER — Telehealth: Payer: Self-pay

## 2022-04-30 NOTE — Telephone Encounter (Signed)
Pt called to ask the results of her cholesterol testing that was added to our labs. I read the results to Latasha Wells myself. Pt appreciative of time spent with her.

## 2022-05-11 ENCOUNTER — Telehealth: Payer: Self-pay

## 2022-05-11 NOTE — Telephone Encounter (Signed)
Labs faxed to PCP

## 2022-05-11 NOTE — Telephone Encounter (Signed)
-----   Message from Dellia Beckwith, MD sent at 05/09/2022  2:38 PM EDT ----- Regarding: call Her lipid panel looks good.  Let her know and forward all labs to her PCP

## 2022-05-12 ENCOUNTER — Encounter: Payer: Self-pay | Admitting: Oncology

## 2022-05-20 DIAGNOSIS — Z45010 Encounter for checking and testing of cardiac pacemaker pulse generator [battery]: Secondary | ICD-10-CM | POA: Diagnosis not present

## 2022-05-20 DIAGNOSIS — I495 Sick sinus syndrome: Secondary | ICD-10-CM | POA: Diagnosis not present

## 2022-07-01 DIAGNOSIS — H01114 Allergic dermatitis of left upper eyelid: Secondary | ICD-10-CM | POA: Diagnosis not present

## 2022-07-01 DIAGNOSIS — H01111 Allergic dermatitis of right upper eyelid: Secondary | ICD-10-CM | POA: Diagnosis not present

## 2022-09-02 DIAGNOSIS — Z1389 Encounter for screening for other disorder: Secondary | ICD-10-CM | POA: Diagnosis not present

## 2022-09-02 DIAGNOSIS — Z0000 Encounter for general adult medical examination without abnormal findings: Secondary | ICD-10-CM | POA: Diagnosis not present

## 2022-09-02 DIAGNOSIS — I10 Essential (primary) hypertension: Secondary | ICD-10-CM | POA: Diagnosis not present

## 2022-09-02 DIAGNOSIS — E669 Obesity, unspecified: Secondary | ICD-10-CM | POA: Diagnosis not present

## 2022-09-02 DIAGNOSIS — Z6838 Body mass index (BMI) 38.0-38.9, adult: Secondary | ICD-10-CM | POA: Diagnosis not present

## 2022-09-02 DIAGNOSIS — I4891 Unspecified atrial fibrillation: Secondary | ICD-10-CM | POA: Diagnosis not present

## 2022-09-02 DIAGNOSIS — N959 Unspecified menopausal and perimenopausal disorder: Secondary | ICD-10-CM | POA: Diagnosis not present

## 2022-09-03 DIAGNOSIS — Z1389 Encounter for screening for other disorder: Secondary | ICD-10-CM | POA: Diagnosis not present

## 2022-09-03 DIAGNOSIS — Z0000 Encounter for general adult medical examination without abnormal findings: Secondary | ICD-10-CM | POA: Diagnosis not present

## 2022-09-03 DIAGNOSIS — R7301 Impaired fasting glucose: Secondary | ICD-10-CM | POA: Diagnosis not present

## 2022-09-03 DIAGNOSIS — E7800 Pure hypercholesterolemia, unspecified: Secondary | ICD-10-CM | POA: Diagnosis not present

## 2022-09-03 DIAGNOSIS — I10 Essential (primary) hypertension: Secondary | ICD-10-CM | POA: Diagnosis not present

## 2022-09-04 LAB — LIPID PANEL
Cholesterol: 204 — AB (ref 0–200)
HDL: 60 (ref 35–70)
LDL Cholesterol: 101
LDl/HDL Ratio: 1.7
Triglycerides: 215 — AB (ref 40–160)

## 2022-09-04 LAB — BASIC METABOLIC PANEL
BUN: 20 (ref 4–21)
CO2: 26 — AB (ref 13–22)
Chloride: 106 (ref 99–108)
Creatinine: 1.2 — AB (ref 0.5–1.1)
Glucose: 79
Potassium: 4.1 meq/L (ref 3.5–5.1)
Sodium: 140 (ref 137–147)

## 2022-09-04 LAB — HEPATIC FUNCTION PANEL
ALT: 12 U/L (ref 7–35)
AST: 13 (ref 13–35)
Alkaline Phosphatase: 73 (ref 25–125)
Bilirubin, Total: 0.3

## 2022-09-04 LAB — HEMOGLOBIN A1C: Hemoglobin A1C: 5.7

## 2022-09-04 LAB — COMPREHENSIVE METABOLIC PANEL
Albumin: 4.3 (ref 3.5–5.0)
Calcium: 9.5 (ref 8.7–10.7)
eGFR: 45

## 2022-09-04 LAB — CBC: RBC: 4.45 (ref 3.87–5.11)

## 2022-09-04 LAB — CBC AND DIFFERENTIAL
HCT: 40 (ref 36–46)
Hemoglobin: 13.1 (ref 12.0–16.0)
Platelets: 268 10*3/uL (ref 150–400)
WBC: 7.3

## 2022-09-21 DIAGNOSIS — L820 Inflamed seborrheic keratosis: Secondary | ICD-10-CM | POA: Diagnosis not present

## 2022-09-21 DIAGNOSIS — Z808 Family history of malignant neoplasm of other organs or systems: Secondary | ICD-10-CM | POA: Diagnosis not present

## 2022-09-21 DIAGNOSIS — L738 Other specified follicular disorders: Secondary | ICD-10-CM | POA: Diagnosis not present

## 2022-09-21 DIAGNOSIS — D485 Neoplasm of uncertain behavior of skin: Secondary | ICD-10-CM | POA: Diagnosis not present

## 2022-09-21 DIAGNOSIS — L821 Other seborrheic keratosis: Secondary | ICD-10-CM | POA: Diagnosis not present

## 2022-09-21 DIAGNOSIS — Z85820 Personal history of malignant melanoma of skin: Secondary | ICD-10-CM | POA: Diagnosis not present

## 2022-09-21 DIAGNOSIS — Z85828 Personal history of other malignant neoplasm of skin: Secondary | ICD-10-CM | POA: Diagnosis not present

## 2022-09-25 DIAGNOSIS — Z13820 Encounter for screening for osteoporosis: Secondary | ICD-10-CM | POA: Diagnosis not present

## 2022-09-25 DIAGNOSIS — Z1231 Encounter for screening mammogram for malignant neoplasm of breast: Secondary | ICD-10-CM | POA: Diagnosis not present

## 2022-09-25 DIAGNOSIS — M8589 Other specified disorders of bone density and structure, multiple sites: Secondary | ICD-10-CM | POA: Diagnosis not present

## 2022-09-25 LAB — HM DEXA SCAN: HM Dexa Scan: NORMAL

## 2022-09-25 LAB — HM MAMMOGRAPHY

## 2022-10-23 NOTE — Progress Notes (Incomplete)
[facility]  [address] [place],  [place]  [address] [phone]  Clinic Day:  04/28/22  Referring physician: Alinda Deem, MD   She needs mammo  CHIEF COMPLAINT:  CC: History of stage IIA malignant melanoma with recurrence  Current Treatment:  Surveillance  HISTORY OF PRESENT ILLNESS:  Chaisty Sagers is a 82 y.o. female with a history of stage IIA malignant melanoma originally diagnosed in 41.  She had a Clark's level II with a Breslow thickness of 7.7 mm, treated with wide excision.  In 1997, she had a recurrence in the left groin and underwent node dissection, followed by 1 year of alpha interferon with Ivar Bury, MD at [facility].  In 1998, when she had just 1 week remaining on the interferon therapy, she developed a recurrence in the left groin again.  She then saw Theda Belfast, MD in [place] and underwent a deep node dissection of the left groin, followed by experimental immune therapy.  She has not had any evidence of recurrence since that time.  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  She sees her dermatologist, Dr. Stefanie Libel, in [facility], regularly.  She states she is up-to-date on screening colonoscopy.  Ultrasound of the abdomen limited right upper quadrant from November 2020 was clear other than suggestion of mild hepatic steatosis.  Bone density scan in November 2021 was normal.  INTERVAL HISTORY:  Jodean is here for routine follow up for her history of stage IIA malignant melanoma, which recurred 5 years later. Patient states that she feels *** and ***.     She denies signs of infection such as sore throat, sinus drainage, cough, or urinary symptoms.  She denies fevers or recurrent chills. She denies pain. She denies nausea, vomiting, chest pain, dyspnea or cough. Her appetite is *** and her weight {Weight change:10426}.  Patient states that she feels well and has no  complaints of pain. 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 She does use compression hose due to her chronic lymphedema of the left leg. She had a chest x-ray done before her appointment with me today and it was clear. Patient inquired about having a lipid panel done today, as at her last PCP appointment her cholesterol was high, but she did not fast that day. She informed me that she feels "out of her body", nauseous, and loss of appetite at times. She believes this may be a caused by her Statin. 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 Her labs today are pending and I will call her with those results. I will add a lipid panel to that also. She will be due for a annual screening mammogram after 05/17/2022 and I will get that scheduled and call her with the results. I will see her back in 6 months with CBC and CMP. She denies signs of infection such as sore throat, sinus drainage, cough, or urinary symptoms.  She denies fevers or recurrent chills. She denies pain. She denies nausea, vomiting, chest pain, dyspnea or cough. Her appetite is ok and her weight has increased 2 pounds over last 6 months .  REVIEW OF SYSTEMS:  Review of Systems  Constitutional: Negative.  Negative for appetite change, chills, diaphoresis, fatigue, fever and unexpected weight change.  HENT:  Negative.  Negative for hearing loss, lump/mass, mouth sores, nosebleeds, sore throat, tinnitus, trouble swallowing and voice change.  Eyes: Negative.  Negative for eye problems and icterus.  Respiratory: Negative.  Negative for chest tightness, cough, hemoptysis, shortness of breath and wheezing.   Cardiovascular:  Positive for leg swelling (chronic lymphedema of the left leg). Negative for chest pain and palpitations.  Gastrointestinal: Negative.  Negative for abdominal distention, abdominal pain, blood in stool, constipation, diarrhea, nausea, rectal pain and  vomiting.  Endocrine: Negative.   Genitourinary: Negative.  Negative for bladder incontinence, difficulty urinating, dyspareunia, dysuria, frequency, hematuria, menstrual problem, nocturia, pelvic pain, vaginal bleeding and vaginal discharge.   Musculoskeletal: Negative.  Negative for arthralgias, back pain, flank pain, gait problem, myalgias, neck pain and neck stiffness.  Skin: Negative.  Negative for itching, rash and wound.  Neurological: Negative.  Negative for dizziness, extremity weakness, gait problem, headaches, light-headedness, numbness, seizures and speech difficulty.  Hematological: Negative.  Negative for adenopathy. Does not bruise/bleed easily.  Psychiatric/Behavioral: Negative.  Negative for confusion, decreased concentration, depression, sleep disturbance and suicidal ideas. The patient is not nervous/anxious.      VITALS:  There were no vitals taken for this visit.  Wt Readings from Last 3 Encounters:  04/28/22 195 lb 3.2 oz (88.5 kg)  10/24/21 193 lb 14.4 oz (88 kg)  04/24/21 199 lb (90.3 kg)    There is no height or weight on file to calculate BMI.  Performance status (ECOG): 1 - Symptomatic but completely ambulatory  PHYSICAL EXAM:  Physical Exam Vitals and nursing note reviewed.  Constitutional:      General: She is not in acute distress.    Appearance: Normal appearance. She is normal weight. She is not ill-appearing, toxic-appearing or diaphoretic.  HENT:     Head: Normocephalic and atraumatic.     Right Ear: Tympanic membrane, ear canal and external ear normal. There is no impacted cerumen.     Left Ear: Tympanic membrane, ear canal and external ear normal. There is no impacted cerumen.     Nose: Nose normal. No congestion or rhinorrhea.     Mouth/Throat:     Mouth: Mucous membranes are moist.     Pharynx: Oropharynx is clear. No oropharyngeal exudate or posterior oropharyngeal erythema.  Eyes:     General: No scleral icterus.       Right eye: No  discharge.        Left eye: No discharge.     Extraocular Movements: Extraocular movements intact.     Conjunctiva/sclera: Conjunctivae normal.     Pupils: Pupils are equal, round, and reactive to light.  Neck:     Vascular: No carotid bruit.  Cardiovascular:     Rate and Rhythm: Normal rate and regular rhythm.     Pulses: Normal pulses.     Heart sounds: Normal heart sounds. No murmur heard.    No friction rub. No gallop.  Pulmonary:     Effort: Pulmonary effort is normal. No respiratory distress.     Breath sounds: Normal breath sounds. No stridor. No wheezing, rhonchi or rales.  Chest:     Chest wall: No tenderness.     Comments: Breast exam deferred.  Abdominal:     General: Bowel sounds are normal. There is no distension.     Palpations: Abdomen is soft. There is no mass.     Tenderness: There is no abdominal tenderness. There is no right CVA tenderness, left CVA tenderness, guarding or rebound.     Hernia: No hernia is present.  Musculoskeletal:        General: No swelling,  tenderness, deformity or signs of injury. Normal range of motion.     Cervical back: Normal range of motion and neck supple. No rigidity or tenderness.     Right lower leg: No edema.     Left lower leg: Edema (minimal) present.     Comments: Firm scar in the left inguinal area.  She does have compression socks on at this time.   Lymphadenopathy:     Cervical: No cervical adenopathy.  Skin:    General: Skin is warm and dry.     Coloration: Skin is not jaundiced or pale.     Findings: No bruising, erythema, lesion or rash.  Neurological:     General: No focal deficit present.     Mental Status: She is alert and oriented to person, place, and time. Mental status is at baseline.     Cranial Nerves: No cranial nerve deficit.     Sensory: No sensory deficit.     Motor: No weakness.     Coordination: Coordination normal.     Gait: Gait normal.     Deep Tendon Reflexes: Reflexes normal.  Psychiatric:         Mood and Affect: Mood normal.        Behavior: Behavior normal.        Thought Content: Thought content normal.        Judgment: Judgment normal.     LABS:      Latest Ref Rng & Units 09/04/2022   12:00 AM 04/28/2022   12:00 AM 10/24/2021   12:00 AM  CBC  WBC  7.3     7.3     5.3      Hemoglobin 12.0 - 16.0 13.1     13.8     13.1      Hematocrit 36 - 46 40     41     39      Platelets 150 - 400 K/uL 268     269     224         This result is from an external source.      Latest Ref Rng & Units 09/04/2022   12:00 AM 04/28/2022   12:00 AM 10/24/2021   12:00 AM  CMP  BUN 4 - 21 20     23     20       Creatinine 0.5 - 1.1 1.2     0.7     0.8      Sodium 137 - 147 140     140     137      Potassium 3.5 - 5.1 mEq/L 4.1     4.7     4.2      Chloride 99 - 108 106     108     108      CO2 13 - 22 26     24     24       Calcium 8.7 - 10.7 9.5     9.5     9.2      Alkaline Phos 25 - 125 73     68     31      AST 13 - 35 13     33     20      ALT 7 - 35 U/L 12     20     15          This result is from an  external source.   STUDIES:     Allergies:  Allergies  Allergen Reactions   Codeine Nausea And Vomiting and Other (See Comments)   Diltiazem Other (See Comments) and Shortness Of Breath    Very weak   Other Other (See Comments)    hycosmine   Hydrocodone Other (See Comments)    Seeing double   Hydrocortisone    Misc. Sulfonamide Containing Compounds Hives    Family history, prefers not to take   Sulfa Antibiotics Other (See Comments)    Family history, prefers not to take   Amoxicillin Rash and Hives   Iohexol Itching and Rash    Current Medications: Current Outpatient Medications  Medication Sig Dispense Refill   amLODipine (NORVASC) 10 MG tablet Take 10 mg by mouth daily.     benazepril (LOTENSIN) 40 MG tablet Take 1 tablet by mouth daily.     calcium-vitamin D (OSCAL WITH D) 500-200 MG-UNIT per tablet Take 1 tablet by mouth daily with breakfast.      chlorthalidone (HYGROTON) 25 MG tablet Take 12.5 mg by mouth daily. (Patient not taking: Reported on 04/24/2021)     Cholecalciferol (VITAMIN D3 PO) Take 1 tablet by mouth daily.     cloNIDine (CATAPRES) 0.1 MG tablet Take 0.1 mg by mouth 2 (two) times daily.     diltiazem (DILACOR XR) 120 MG 24 hr capsule Take 120 mg by mouth daily.     ELIQUIS 5 MG TABS tablet Take 5 mg by mouth 2 (two) times daily.     flecainide (TAMBOCOR) 50 MG tablet Take 50 mg by mouth 2 (two) times daily.     Glucosamine HCl (GLUCOSAMINE PO) Glucosamine     ketoconazole (NIZORAL) 2 % cream APPLY DAILY TO RASH AS DIRECTED.     MAGNESIUM PO magnesium     mupirocin ointment (BACTROBAN) 2 % Apply 1 Application topically 3 (three) times daily.     NON FORMULARY Mushroom herbal supplement     Omega-3 Fatty Acids (FISH OIL) 1000 MG CAPS Fish Oil 1,000 mg (120 mg-180 mg) capsule  Take by oral route.     potassium chloride SA (KLOR-CON M) 20 MEQ tablet Take 1 tablet by mouth 2 (two) times daily.     TART CHERRY PO Tart Cherry     triamcinolone cream (KENALOG) 0.1 % Apply topically.     VITAMIN E PO Take 1 capsule by mouth daily.     No current facility-administered medications for this visit.    ASSESSMENT & PLAN:  Assessment:   1.  History of recurrent melanoma twice, originally diagnosed in 75. She has been disease free for many years.    2.  Longstanding lymphedema of the lower extremity as a result of her prior treatment, but this remains stable.  3.  Occasional dyspnea with exertion.  Her chest x-ray is clear.    Plan: She had a pacemaker put in place in November, 2023. She does use compression hose due to her chronic lymphedema of the left leg. She had a chest x-ray done before her appointment with me today 04/28/2022 and it was clear. 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 Her labs today are pending and I will call her with those results. I will add a  lipid panel to that also. She will be due for a annual screening mammogram after 05/17/2022 and I will get that scheduled and call her with the results. I will see her back  in 6 months with CBC and CMP. She understands and agrees with this plan of care.  I provided 30 minutes of face-to-face time during this this encounter and > 50% was spent counseling as documented under my assessment and plan.    Dellia Beckwith, MD [facility] [address] [place] [address] Dept: [phone] Dept Fax: [phone]     Rulon Sera Lassiter,acting as a scribe for Dellia Beckwith, MD.,have documented all relevant documentation on the behalf of Dellia Beckwith, MD,as directed by  Dellia Beckwith, MD while in the presence of Dellia Beckwith, MD.

## 2022-10-23 NOTE — Progress Notes (Signed)
[facility]  [address] [place],  [place]  [address] [phone]  Clinic Day:  10/27/22   Referring physician: Alinda Deem, MD   CHIEF COMPLAINT:  CC: History of stage IIA malignant melanoma with recurrence  Current Treatment:  Surveillance  HISTORY OF PRESENT ILLNESS:  Latasha Wells is a 82 y.o. female with a history of stage IIA malignant melanoma originally diagnosed in 13.  She had a Clark's level II with a Breslow thickness of 7.7 mm, treated with wide excision.  In 1997, she had a recurrence in the left groin and underwent node dissection, followed by 1 year of alpha interferon with Ivar Bury, MD at [facility].  In 1998, when she had just 1 week remaining on the interferon therapy, she developed a recurrence in the left groin again.  She then saw Theda Belfast, MD in [place] and underwent a deep node dissection of the left groin, followed by experimental immune therapy.  She has not had any evidence of recurrence since that time.  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  She sees her dermatologist, Dr. Stefanie Libel, in [facility], regularly.  She states she is up-to-date on screening colonoscopy.  Ultrasound of the abdomen limited right upper quadrant from November 2020 was clear other than suggestion of mild hepatic steatosis.  Bone density scan in November 2021 was normal.  INTERVAL HISTORY:  Latasha Wells is here for routine follow up for her history of stage IIA malignant melanoma, which recurred 5 years later. Patient states that she feels well and has no complaints of pain. She had a screening bilateral mammogram done on 09/25/2022 that was clear. She also had a bone density scan done on 09/25/2022 that was completely normal. She still has an pacemaker located in the left upper chest wall which was placed 11 months ago. Her labs today are normal. I will see her back in 6 months with CBC, CMP,  and chest X-ray.   She denies signs of infection such as sore throat, sinus drainage, cough, or urinary symptoms.  She denies fevers or recurrent chills. She denies pain. She denies nausea, vomiting, chest pain, dyspnea or cough. Her appetite is good and her weight has increased 1 pounds over last 6 months .  REVIEW OF SYSTEMS:  Review of Systems  Constitutional: Negative.  Negative for appetite change, chills, diaphoresis, fatigue, fever and unexpected weight change.  HENT:  Negative.  Negative for hearing loss, lump/mass, mouth sores, nosebleeds, sore throat, tinnitus, trouble swallowing and voice change.   Eyes: Negative.  Negative for eye problems and icterus.  Respiratory: Negative.  Negative for chest tightness, cough, hemoptysis, shortness of breath and wheezing.   Cardiovascular:  Positive for leg swelling (chronic lymphedema of the left leg). Negative for chest pain and palpitations.  Gastrointestinal: Negative.  Negative for abdominal distention, abdominal pain, blood in stool, constipation, diarrhea, nausea, rectal pain and vomiting.  Endocrine: Negative.   Genitourinary: Negative.  Negative for bladder incontinence, difficulty urinating, dyspareunia, dysuria, frequency, hematuria, menstrual problem, nocturia, pelvic pain, vaginal bleeding and vaginal discharge.   Musculoskeletal: Negative.  Negative for arthralgias, back pain, flank pain, gait problem, myalgias, neck pain and neck stiffness.  Skin: Negative.  Negative for itching, rash and wound.  Neurological: Negative.  Negative for dizziness, extremity weakness, gait problem, headaches, light-headedness, numbness, seizures and speech difficulty.  Hematological: Negative.  Negative for adenopathy. Does not bruise/bleed easily.  Psychiatric/Behavioral: Negative.  Negative for confusion,  decreased concentration, depression, sleep disturbance and suicidal ideas. The patient is not nervous/anxious.      VITALS:  Blood pressure (!)  168/80, pulse 75, temperature 97.8 F (36.6 C), temperature source Oral, resp. rate 16, height 5\' 1"  (1.549 m), weight 196 lb 14.4 oz (89.3 kg), SpO2 96%.  Wt Readings from Last 3 Encounters:  10/27/22 196 lb 14.4 oz (89.3 kg)  04/28/22 195 lb 3.2 oz (88.5 kg)  10/24/21 193 lb 14.4 oz (88 kg)    Body mass index is 37.2 kg/m.  Performance status (ECOG): 1 - Symptomatic but completely ambulatory  PHYSICAL EXAM:  Physical Exam Vitals and nursing note reviewed.  Constitutional:      General: She is not in acute distress.    Appearance: Normal appearance. She is normal weight. She is not ill-appearing, toxic-appearing or diaphoretic.  HENT:     Head: Normocephalic and atraumatic.     Right Ear: Tympanic membrane, ear canal and external ear normal. There is no impacted cerumen.     Left Ear: Tympanic membrane, ear canal and external ear normal. There is no impacted cerumen.     Nose: Nose normal. No congestion or rhinorrhea.     Mouth/Throat:     Mouth: Mucous membranes are moist.     Pharynx: Oropharynx is clear. No oropharyngeal exudate or posterior oropharyngeal erythema.  Eyes:     General: No scleral icterus.       Right eye: No discharge.        Left eye: No discharge.     Extraocular Movements: Extraocular movements intact.     Conjunctiva/sclera: Conjunctivae normal.     Pupils: Pupils are equal, round, and reactive to light.  Neck:     Vascular: No carotid bruit.  Cardiovascular:     Rate and Rhythm: Normal rate and regular rhythm.     Pulses: Normal pulses.     Heart sounds: Normal heart sounds. No murmur heard.    No friction rub. No gallop.  Pulmonary:     Effort: Pulmonary effort is normal. No respiratory distress.     Breath sounds: Normal breath sounds. No stridor. No wheezing, rhonchi or rales.  Chest:     Chest wall: No tenderness.     Comments: No masses in either breasts.  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  Abdominal:     General: Bowel sounds are  normal. There is no distension.     Palpations: Abdomen is soft. There is no mass.     Tenderness: There is no abdominal tenderness. There is no right CVA tenderness, left CVA tenderness, guarding or rebound.     Hernia: No hernia is present.  Musculoskeletal:        General: No swelling, tenderness, deformity or signs of injury. Normal range of motion.     Cervical back: Normal range of motion and neck supple. No rigidity or tenderness.     Right lower leg: No edema.     Left lower leg: Edema (minimal) present.     Comments: Firm scar in the left inguinal area.  She does have compression socks on at this time but does have mild edema of the left lower extremity.   Lymphadenopathy:     Cervical: No cervical adenopathy.  Skin:    General: Skin is warm and dry.     Coloration: Skin is not jaundiced or pale.     Findings: No bruising, erythema, lesion or rash.  Neurological:  General: No focal deficit present.     Mental Status: She is alert and oriented to person, place, and time. Mental status is at baseline.     Cranial Nerves: No cranial nerve deficit.     Sensory: No sensory deficit.     Motor: No weakness.     Coordination: Coordination normal.     Gait: Gait normal.     Deep Tendon Reflexes: Reflexes normal.  Psychiatric:        Mood and Affect: Mood normal.        Behavior: Behavior normal.        Thought Content: Thought content normal.        Judgment: Judgment normal.     LABS:      Latest Ref Rng & Units 10/27/2022    2:32 PM 09/04/2022   12:00 AM 04/28/2022   12:00 AM  CBC  WBC 4.0 - 10.5 K/uL 6.8  7.3     7.3      Hemoglobin 12.0 - 15.0 g/dL 84.6  96.2     95.2      Hematocrit 36.0 - 46.0 % 40.6  40     41      Platelets 150 - 400 K/uL 257  268     269         This result is from an external source.      Latest Ref Rng & Units 10/27/2022    2:32 PM 09/04/2022   12:00 AM 04/28/2022   12:00 AM  CMP  Glucose 70 - 99 mg/dL 97     BUN 8 - 23 mg/dL 16  20      23       Creatinine 0.44 - 1.00 mg/dL 8.41  1.2     0.7      Sodium 135 - 145 mmol/L 140  140     140      Potassium 3.5 - 5.1 mmol/L 3.9  4.1     4.7      Chloride 98 - 111 mmol/L 107  106     108      CO2 22 - 32 mmol/L 25  26     24       Calcium 8.9 - 10.3 mg/dL 9.3  9.5     9.5      Total Protein 6.5 - 8.1 g/dL 6.5     Total Bilirubin 0.3 - 1.2 mg/dL 0.7     Alkaline Phos 38 - 126 U/L 52  73     68      AST 15 - 41 U/L 18  13     33      ALT 0 - 44 U/L 14  12     20          This result is from an external source.   STUDIES:  Exam: 09/25/2022 Digital Screening Bilateral mammogram with Tomosynthesis and CAD Impression: No evidence of malignancy.      Allergies:  Allergies  Allergen Reactions   Codeine Nausea And Vomiting and Other (See Comments)   Diltiazem Other (See Comments) and Shortness Of Breath    Very weak   Other Other (See Comments)    hycosmine   Hydrocodone Other (See Comments)    Seeing double   Hydrocortisone    Misc. Sulfonamide Containing Compounds Hives    Family history, prefers not to take   Sulfa Antibiotics Other (See Comments) and Hives    Family history, prefers  not to take   Amoxicillin Rash and Hives   Iohexol Itching and Rash    Current Medications: Current Outpatient Medications  Medication Sig Dispense Refill   atorvastatin (LIPITOR) 20 MG tablet Take 20 mg by mouth daily.     amLODipine (NORVASC) 10 MG tablet Take 10 mg by mouth daily.     benazepril (LOTENSIN) 40 MG tablet Take 1 tablet by mouth daily.     calcium-vitamin D (OSCAL WITH D) 500-200 MG-UNIT per tablet Take 1 tablet by mouth daily with breakfast.     chlorthalidone (HYGROTON) 25 MG tablet Take 12.5 mg by mouth daily. (Patient not taking: Reported on 04/24/2021)     Cholecalciferol (VITAMIN D3 PO) Take 1 tablet by mouth daily.     cloNIDine (CATAPRES) 0.1 MG tablet Take 0.1 mg by mouth 2 (two) times daily.     diltiazem (DILACOR XR) 120 MG 24 hr capsule Take 120 mg by  mouth daily.     ELIQUIS 5 MG TABS tablet Take 5 mg by mouth 2 (two) times daily.     flecainide (TAMBOCOR) 50 MG tablet Take 50 mg by mouth 2 (two) times daily.     Glucosamine HCl (GLUCOSAMINE PO) Glucosamine     ketoconazole (NIZORAL) 2 % cream APPLY DAILY TO RASH AS DIRECTED.     MAGNESIUM PO magnesium     mupirocin ointment (BACTROBAN) 2 % Apply 1 Application topically 3 (three) times daily.     NON FORMULARY Mushroom herbal supplement     Omega-3 Fatty Acids (FISH OIL) 1000 MG CAPS Fish Oil 1,000 mg (120 mg-180 mg) capsule  Take by oral route.     potassium chloride SA (KLOR-CON M) 20 MEQ tablet Take 1 tablet by mouth 2 (two) times daily.     TART CHERRY PO Tart Cherry     triamcinolone cream (KENALOG) 0.1 % Apply topically.     VITAMIN E PO Take 1 capsule by mouth daily.     No current facility-administered medications for this visit.    ASSESSMENT & PLAN:  Assessment:   1.  History of recurrent melanoma twice, originally diagnosed in 39. She has been disease free for many years.    2.  Longstanding lymphedema of the lower extremity as a result of her prior treatment, but this remains stable.  3.  Occasional dyspnea with exertion.  Her chest x-ray is clear.    Plan: She had a screening bilateral mammogram done on 09/25/2022 that was clear. She also had a bone density scan done on 09/25/2022 that was completely normal. She still has an pacemaker located in the left upper chest wall which was placed 11 months ago. Her labs today are normal. I will see her back in 6 months with CBC, CMP, and chest X-ray. She understands and agrees with this plan of care.  I provided 12 minutes of face-to-face time during this this encounter and > 50% was spent counseling as documented under my assessment and plan.    Dellia Beckwith, MD [facility] [address] [place] [address] Dept: [phone] Dept Fax:  [phone]     Rulon Sera Lassiter,acting as a scribe for Dellia Beckwith, MD.,have documented all relevant documentation on the behalf of Dellia Beckwith, MD,as directed by  Dellia Beckwith, MD while in the presence of Dellia Beckwith, MD.

## 2022-10-27 ENCOUNTER — Other Ambulatory Visit: Payer: Self-pay | Admitting: Oncology

## 2022-10-27 ENCOUNTER — Inpatient Hospital Stay: Payer: Medicare Other

## 2022-10-27 ENCOUNTER — Inpatient Hospital Stay: Payer: Medicare Other | Attending: Oncology | Admitting: Oncology

## 2022-10-27 ENCOUNTER — Other Ambulatory Visit: Payer: Medicare Other

## 2022-10-27 ENCOUNTER — Ambulatory Visit: Payer: Medicare Other | Admitting: Oncology

## 2022-10-27 ENCOUNTER — Encounter: Payer: Self-pay | Admitting: Oncology

## 2022-10-27 VITALS — BP 168/80 | HR 75 | Temp 97.8°F | Resp 16 | Ht 61.0 in | Wt 196.9 lb

## 2022-10-27 DIAGNOSIS — C774 Secondary and unspecified malignant neoplasm of inguinal and lower limb lymph nodes: Secondary | ICD-10-CM

## 2022-10-27 DIAGNOSIS — Z85820 Personal history of malignant melanoma of skin: Secondary | ICD-10-CM | POA: Insufficient documentation

## 2022-10-27 DIAGNOSIS — I890 Lymphedema, not elsewhere classified: Secondary | ICD-10-CM | POA: Insufficient documentation

## 2022-10-27 DIAGNOSIS — Z950 Presence of cardiac pacemaker: Secondary | ICD-10-CM | POA: Diagnosis not present

## 2022-10-27 DIAGNOSIS — C4372 Malignant melanoma of left lower limb, including hip: Secondary | ICD-10-CM | POA: Diagnosis not present

## 2022-10-27 DIAGNOSIS — R0600 Dyspnea, unspecified: Secondary | ICD-10-CM | POA: Diagnosis not present

## 2022-10-27 LAB — CBC WITH DIFFERENTIAL (CANCER CENTER ONLY)
Abs Immature Granulocytes: 0.02 10*3/uL (ref 0.00–0.07)
Basophils Absolute: 0 10*3/uL (ref 0.0–0.1)
Basophils Relative: 0 %
Eosinophils Absolute: 0.2 10*3/uL (ref 0.0–0.5)
Eosinophils Relative: 4 %
HCT: 40.6 % (ref 36.0–46.0)
Hemoglobin: 13.1 g/dL (ref 12.0–15.0)
Immature Granulocytes: 0 %
Lymphocytes Relative: 31 %
Lymphs Abs: 2.1 10*3/uL (ref 0.7–4.0)
MCH: 30.8 pg (ref 26.0–34.0)
MCHC: 32.3 g/dL (ref 30.0–36.0)
MCV: 95.5 fL (ref 80.0–100.0)
Monocytes Absolute: 0.6 10*3/uL (ref 0.1–1.0)
Monocytes Relative: 8 %
Neutro Abs: 3.9 10*3/uL (ref 1.7–7.7)
Neutrophils Relative %: 57 %
Platelet Count: 257 10*3/uL (ref 150–400)
RBC: 4.25 MIL/uL (ref 3.87–5.11)
RDW: 14.4 % (ref 11.5–15.5)
WBC Count: 6.8 10*3/uL (ref 4.0–10.5)
nRBC: 0 % (ref 0.0–0.2)

## 2022-10-27 LAB — CMP (CANCER CENTER ONLY)
ALT: 14 U/L (ref 0–44)
AST: 18 U/L (ref 15–41)
Albumin: 4 g/dL (ref 3.5–5.0)
Alkaline Phosphatase: 52 U/L (ref 38–126)
Anion gap: 8 (ref 5–15)
BUN: 16 mg/dL (ref 8–23)
CO2: 25 mmol/L (ref 22–32)
Calcium: 9.3 mg/dL (ref 8.9–10.3)
Chloride: 107 mmol/L (ref 98–111)
Creatinine: 0.97 mg/dL (ref 0.44–1.00)
GFR, Estimated: 58 mL/min — ABNORMAL LOW (ref >60)
Glucose, Bld: 97 mg/dL (ref 70–99)
Potassium: 3.9 mmol/L (ref 3.5–5.1)
Sodium: 140 mmol/L (ref 135–145)
Total Bilirubin: 0.7 mg/dL (ref 0.3–1.2)
Total Protein: 6.5 g/dL (ref 6.5–8.1)

## 2022-10-29 ENCOUNTER — Telehealth: Payer: Self-pay | Admitting: Oncology

## 2022-10-29 NOTE — Telephone Encounter (Signed)
Contacted pt to schedule an appt. Unable to reach via phone, voicemail was left.   Message Header  Department: [facility] [[id number]]   Scheduling Message Entered by Dellia Beckwith on 10/27/2022 at  3:20 PM Priority: Routine <No visit type provided>  Department: [facility]  Provider:  Scheduling Notes:  RT in 6 months with labs and chest xray that day

## 2022-11-11 DIAGNOSIS — I495 Sick sinus syndrome: Secondary | ICD-10-CM | POA: Diagnosis not present

## 2022-11-11 DIAGNOSIS — I480 Paroxysmal atrial fibrillation: Secondary | ICD-10-CM | POA: Diagnosis not present

## 2022-11-11 DIAGNOSIS — Z45018 Encounter for adjustment and management of other part of cardiac pacemaker: Secondary | ICD-10-CM | POA: Diagnosis not present

## 2022-11-22 DIAGNOSIS — I480 Paroxysmal atrial fibrillation: Secondary | ICD-10-CM | POA: Diagnosis not present

## 2022-11-22 DIAGNOSIS — Z45018 Encounter for adjustment and management of other part of cardiac pacemaker: Secondary | ICD-10-CM | POA: Diagnosis not present

## 2022-11-22 DIAGNOSIS — I495 Sick sinus syndrome: Secondary | ICD-10-CM | POA: Diagnosis not present

## 2022-12-01 DIAGNOSIS — I959 Hypotension, unspecified: Secondary | ICD-10-CM | POA: Diagnosis not present

## 2022-12-01 DIAGNOSIS — R197 Diarrhea, unspecified: Secondary | ICD-10-CM | POA: Diagnosis not present

## 2022-12-01 DIAGNOSIS — R0602 Shortness of breath: Secondary | ICD-10-CM | POA: Diagnosis not present

## 2022-12-01 DIAGNOSIS — K449 Diaphragmatic hernia without obstruction or gangrene: Secondary | ICD-10-CM | POA: Diagnosis not present

## 2022-12-01 DIAGNOSIS — I700 Atherosclerosis of aorta: Secondary | ICD-10-CM | POA: Diagnosis not present

## 2022-12-01 DIAGNOSIS — N281 Cyst of kidney, acquired: Secondary | ICD-10-CM | POA: Diagnosis not present

## 2022-12-01 DIAGNOSIS — Z20822 Contact with and (suspected) exposure to covid-19: Secondary | ICD-10-CM | POA: Diagnosis not present

## 2022-12-01 DIAGNOSIS — R000 Tachycardia, unspecified: Secondary | ICD-10-CM | POA: Diagnosis not present

## 2022-12-01 DIAGNOSIS — R531 Weakness: Secondary | ICD-10-CM | POA: Diagnosis not present

## 2022-12-01 DIAGNOSIS — R231 Pallor: Secondary | ICD-10-CM | POA: Diagnosis not present

## 2022-12-01 DIAGNOSIS — G4489 Other headache syndrome: Secondary | ICD-10-CM | POA: Diagnosis not present

## 2022-12-01 DIAGNOSIS — Z7901 Long term (current) use of anticoagulants: Secondary | ICD-10-CM | POA: Diagnosis not present

## 2022-12-01 DIAGNOSIS — R112 Nausea with vomiting, unspecified: Secondary | ICD-10-CM | POA: Diagnosis not present

## 2022-12-01 DIAGNOSIS — I4891 Unspecified atrial fibrillation: Secondary | ICD-10-CM | POA: Diagnosis not present

## 2022-12-01 DIAGNOSIS — I499 Cardiac arrhythmia, unspecified: Secondary | ICD-10-CM | POA: Diagnosis not present

## 2022-12-02 DIAGNOSIS — R112 Nausea with vomiting, unspecified: Secondary | ICD-10-CM | POA: Diagnosis not present

## 2022-12-02 DIAGNOSIS — I4891 Unspecified atrial fibrillation: Secondary | ICD-10-CM | POA: Diagnosis not present

## 2022-12-02 DIAGNOSIS — Z45018 Encounter for adjustment and management of other part of cardiac pacemaker: Secondary | ICD-10-CM | POA: Diagnosis not present

## 2022-12-02 DIAGNOSIS — R531 Weakness: Secondary | ICD-10-CM | POA: Diagnosis not present

## 2022-12-02 DIAGNOSIS — R197 Diarrhea, unspecified: Secondary | ICD-10-CM | POA: Diagnosis not present

## 2022-12-08 DIAGNOSIS — A084 Viral intestinal infection, unspecified: Secondary | ICD-10-CM | POA: Diagnosis not present

## 2022-12-08 DIAGNOSIS — K219 Gastro-esophageal reflux disease without esophagitis: Secondary | ICD-10-CM | POA: Diagnosis not present

## 2023-02-10 DIAGNOSIS — Z23 Encounter for immunization: Secondary | ICD-10-CM | POA: Diagnosis not present

## 2023-02-16 DIAGNOSIS — I495 Sick sinus syndrome: Secondary | ICD-10-CM | POA: Diagnosis not present

## 2023-02-16 DIAGNOSIS — I4819 Other persistent atrial fibrillation: Secondary | ICD-10-CM | POA: Diagnosis not present

## 2023-02-16 DIAGNOSIS — R011 Cardiac murmur, unspecified: Secondary | ICD-10-CM | POA: Diagnosis not present

## 2023-02-16 DIAGNOSIS — Z7901 Long term (current) use of anticoagulants: Secondary | ICD-10-CM | POA: Diagnosis not present

## 2023-02-16 DIAGNOSIS — Z950 Presence of cardiac pacemaker: Secondary | ICD-10-CM | POA: Diagnosis not present

## 2023-03-03 DIAGNOSIS — I272 Pulmonary hypertension, unspecified: Secondary | ICD-10-CM | POA: Diagnosis not present

## 2023-03-03 DIAGNOSIS — I081 Rheumatic disorders of both mitral and tricuspid valves: Secondary | ICD-10-CM | POA: Diagnosis not present

## 2023-03-17 DIAGNOSIS — I10 Essential (primary) hypertension: Secondary | ICD-10-CM | POA: Diagnosis not present

## 2023-03-17 DIAGNOSIS — Z6839 Body mass index (BMI) 39.0-39.9, adult: Secondary | ICD-10-CM | POA: Diagnosis not present

## 2023-03-17 DIAGNOSIS — M1000 Idiopathic gout, unspecified site: Secondary | ICD-10-CM | POA: Diagnosis not present

## 2023-03-17 DIAGNOSIS — I4821 Permanent atrial fibrillation: Secondary | ICD-10-CM | POA: Diagnosis not present

## 2023-03-18 LAB — LAB REPORT - SCANNED: EGFR: 64

## 2023-03-23 DIAGNOSIS — I272 Pulmonary hypertension, unspecified: Secondary | ICD-10-CM | POA: Diagnosis not present

## 2023-03-23 DIAGNOSIS — Z7901 Long term (current) use of anticoagulants: Secondary | ICD-10-CM | POA: Diagnosis not present

## 2023-03-23 DIAGNOSIS — Z45018 Encounter for adjustment and management of other part of cardiac pacemaker: Secondary | ICD-10-CM | POA: Diagnosis not present

## 2023-03-23 DIAGNOSIS — Z950 Presence of cardiac pacemaker: Secondary | ICD-10-CM | POA: Diagnosis not present

## 2023-03-23 DIAGNOSIS — R0609 Other forms of dyspnea: Secondary | ICD-10-CM | POA: Diagnosis not present

## 2023-04-19 ENCOUNTER — Other Ambulatory Visit: Payer: Self-pay | Admitting: Oncology

## 2023-04-19 DIAGNOSIS — C774 Secondary and unspecified malignant neoplasm of inguinal and lower limb lymph nodes: Secondary | ICD-10-CM

## 2023-04-19 NOTE — Addendum Note (Signed)
 Addended by: Lorenda Grecco M on: 04/19/2023 02:55 PM   Modules accepted: Orders

## 2023-04-20 ENCOUNTER — Other Ambulatory Visit: Payer: Self-pay | Admitting: Oncology

## 2023-04-20 DIAGNOSIS — C4372 Malignant melanoma of left lower limb, including hip: Secondary | ICD-10-CM

## 2023-04-23 NOTE — Progress Notes (Signed)
 [facility]  [address] [place],  [place]  [address] [phone]  Clinic Day:  04/27/23  Referring physician: Orin Birk, MD   CHIEF COMPLAINT:  CC: History of stage IIA malignant melanoma with recurrence  Current Treatment:  Surveillance  HISTORY OF PRESENT ILLNESS:  Latasha Wells is a 83 y.o. female with a history of stage IIA malignant melanoma originally diagnosed in 82.  She had a Clark's level II with a Breslow thickness of 7.7 mm, treated with wide excision.  In 1997, she had a recurrence in the left groin and underwent node dissection, followed by 1 year of alpha interferon with Towana Freshwater, MD at [facility].  In 1998, when she had just 1 week remaining on the interferon therapy, she developed a recurrence in the left groin again.  She then saw Tilman Fonder, MD in [place] and underwent a deep node dissection of the left groin, followed by experimental immune therapy.  She has not had any evidence of recurrence since that time.  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  She sees her dermatologist, Dr. Acey Holding, in [facility], regularly.  She states she is up-to-date on screening colonoscopy.  Ultrasound of the abdomen limited right upper quadrant from November 2020 was clear other than suggestion of mild hepatic steatosis.  Bone density scan in November 2021 was normal.  INTERVAL HISTORY:  Latasha Wells is here for routine follow up for her history of stage IIA malignant melanoma, which recurred 5 years later. Patient states that she feels well but complains of an occasional sharp pain on her left arm. She states that this occurs when she is reading and holding her arm up for a extended amount of time but not with day to day activity. I believe she could be developing tendonitis and recommend she position her arm differently or apply heat when the pain occurs. I also recommended a anti-inflammatory if she can tolerate  it. She continues to see dermatology annually. Patient had a chest x-ray done today and the report is pending. We reviewed the images in detail and everything looks good. She had labs done on 03/23/2023 and CBC and CMP were normal. She was found to have a slightly low magnesium of 1.8 so I will add an additional magnesium level to today's labs. She has a WBC of 8.1, hemoglobin of 14.1, and platelet count of 268,000. Her CMP is normal other than a slightly elevated creatinine of 1.03. Her B-12 and magnesium levels are pending. I will see her back in 6 months with CBC, CMP, and magnesium level. She denies signs of infection such as sore throat, sinus drainage, cough, or urinary symptoms.  She denies fevers or recurrent chills. She denies pain. She denies nausea, vomiting, chest pain, dyspnea or cough. Her appetite is very good and her weight has increased 3 pounds over last 6 months .    REVIEW OF SYSTEMS:  Review of Systems  Constitutional: Negative.  Negative for appetite change, chills, diaphoresis, fatigue, fever and unexpected weight change.  HENT:  Negative.  Negative for hearing loss, lump/mass, mouth sores, nosebleeds, sore throat, tinnitus, trouble swallowing and voice change.   Eyes: Negative.  Negative for eye problems and icterus.  Respiratory: Negative.  Negative for chest tightness, cough, hemoptysis, shortness of breath and wheezing.   Cardiovascular:  Positive for leg swelling (chronic lymphedema of the left leg). Negative for chest pain and palpitations.  Gastrointestinal: Negative.  Negative for abdominal  distention, abdominal pain, blood in stool, constipation, diarrhea, nausea, rectal pain and vomiting.  Endocrine: Negative.   Genitourinary: Negative.  Negative for bladder incontinence, difficulty urinating, dyspareunia, dysuria, frequency, hematuria, menstrual problem, nocturia, pelvic pain, vaginal bleeding and vaginal discharge.   Musculoskeletal: Negative.  Negative for arthralgias,  back pain, flank pain, gait problem, myalgias, neck pain and neck stiffness.       Occasional sharp left upper arm pain  Skin: Negative.  Negative for itching, rash and wound.  Neurological: Negative.  Negative for dizziness, extremity weakness, gait problem, headaches, light-headedness, numbness, seizures and speech difficulty.  Hematological: Negative.  Negative for adenopathy. Does not bruise/bleed easily.  Psychiatric/Behavioral: Negative.  Negative for confusion, decreased concentration, depression, sleep disturbance and suicidal ideas. The patient is not nervous/anxious.      VITALS:  Blood pressure 126/68, pulse 76, temperature 97.6 F (36.4 C), temperature source Oral, resp. rate 16, height 5\' 1"  (1.549 m), weight 199 lb 3.2 oz (90.4 kg), SpO2 97%.  Wt Readings from Last 3 Encounters:  04/27/23 199 lb 3.2 oz (90.4 kg)  10/27/22 196 lb 14.4 oz (89.3 kg)  04/28/22 195 lb 3.2 oz (88.5 kg)    Body mass index is 37.64 kg/m.  Performance status (ECOG): 1 - Symptomatic but completely ambulatory  PHYSICAL EXAM:  Physical Exam Vitals and nursing note reviewed.  Constitutional:      General: She is not in acute distress.    Appearance: Normal appearance. She is normal weight. She is not ill-appearing, toxic-appearing or diaphoretic.  HENT:     Head: Normocephalic and atraumatic.     Right Ear: Tympanic membrane, ear canal and external ear normal. There is no impacted cerumen.     Left Ear: Tympanic membrane, ear canal and external ear normal. There is no impacted cerumen.     Nose: Nose normal. No congestion or rhinorrhea.     Mouth/Throat:     Mouth: Mucous membranes are moist.     Pharynx: Oropharynx is clear. No oropharyngeal exudate or posterior oropharyngeal erythema.  Eyes:     General: No scleral icterus.       Right eye: No discharge.        Left eye: No discharge.     Extraocular Movements: Extraocular movements intact.     Conjunctiva/sclera: Conjunctivae normal.      Pupils: Pupils are equal, round, and reactive to light.  Neck:     Vascular: No carotid bruit.  Cardiovascular:     Rate and Rhythm: Normal rate and regular rhythm.     Pulses: Normal pulses.     Heart sounds: Normal heart sounds. No murmur heard.    No friction rub. No gallop.  Pulmonary:     Effort: Pulmonary effort is normal. No respiratory distress.     Breath sounds: Normal breath sounds. No stridor. No wheezing, rhonchi or rales.  Chest:     Chest wall: No tenderness.     Comments: No masses in either breasts.  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  Abdominal:     General: Bowel sounds are normal. There is no distension.     Palpations: Abdomen is soft. There is no mass.     Tenderness: There is no abdominal tenderness. There is no right CVA tenderness, left CVA tenderness, guarding or rebound.     Hernia: No hernia is present.     Comments: Small 1-2cm firm nodule just below the right costal margin in the skin.    Musculoskeletal:  General: No swelling, tenderness, deformity or signs of injury. Normal range of motion.     Cervical back: Normal range of motion and neck supple. No rigidity or tenderness.     Right lower leg: No edema.     Left lower leg: Edema (minimal) present.     Comments: She does have compression socks on at this time but does have mild edema of the left lower extremity.   Lymphadenopathy:     Cervical: No cervical adenopathy.  Skin:    General: Skin is warm and dry.     Coloration: Skin is not jaundiced or pale.     Findings: No bruising, erythema, lesion or rash.  Neurological:     General: No focal deficit present.     Mental Status: She is alert and oriented to person, place, and time. Mental status is at baseline.     Cranial Nerves: No cranial nerve deficit.     Sensory: No sensory deficit.     Motor: No weakness.     Coordination: Coordination normal.     Gait: Gait normal.     Deep Tendon Reflexes: Reflexes normal.  Psychiatric:         Mood and Affect: Mood normal.        Behavior: Behavior normal.        Thought Content: Thought content normal.        Judgment: Judgment normal.     LABS:      Latest Ref Rng & Units 04/27/2023   10:19 AM 10/27/2022    2:32 PM 09/04/2022   12:00 AM  CBC  WBC 4.0 - 10.5 K/uL 8.1  6.8  7.3      Hemoglobin 12.0 - 15.0 g/dL 16.1  09.6  04.5      Hematocrit 36.0 - 46.0 % 42.1  40.6  40      Platelets 150 - 400 K/uL 268  257  268         This result is from an external source.      Latest Ref Rng & Units 04/27/2023   10:19 AM 10/27/2022    2:32 PM 09/04/2022   12:00 AM  CMP  Glucose 70 - 99 mg/dL 409  97    BUN 8 - 23 mg/dL 14  16  20       Creatinine 0.44 - 1.00 mg/dL 8.11  9.14  1.2      Sodium 135 - 145 mmol/L 141  140  140      Potassium 3.5 - 5.1 mmol/L 4.4  3.9  4.1      Chloride 98 - 111 mmol/L 106  107  106      CO2 22 - 32 mmol/L 23  25  26       Calcium 8.9 - 10.3 mg/dL 9.5  9.3  9.5      Total Protein 6.5 - 8.1 g/dL 6.7  6.5    Total Bilirubin 0.0 - 1.2 mg/dL 0.4  0.7    Alkaline Phos 38 - 126 U/L 88  52  73      AST 15 - 41 U/L 20  18  13       ALT 0 - 44 U/L 11  14  12          This result is from an external source.   STUDIES:        Allergies:  Allergies  Allergen Reactions   Codeine Nausea And Vomiting and Other (See Comments)  Diltiazem Other (See Comments) and Shortness Of Breath    Very weak   Other Other (See Comments)    hycosmine   Hydrocodone Other (See Comments)    Seeing double   Hydrocortisone    Misc. Sulfonamide Containing Compounds Hives    Family history, prefers not to take   Sulfa Antibiotics Other (See Comments) and Hives    Family history, prefers not to take   Amoxicillin Rash and Hives   Iohexol Itching and Rash    Current Medications: Current Outpatient Medications  Medication Sig Dispense Refill   flecainide (TAMBOCOR) 100 MG tablet Take 100 mg by mouth.     amLODipine (NORVASC) 10 MG tablet Take 10 mg by mouth  daily.     atorvastatin (LIPITOR) 20 MG tablet Take 20 mg by mouth daily.     benazepril (LOTENSIN) 40 MG tablet Take 1 tablet by mouth daily.     calcium-vitamin D (OSCAL WITH D) 500-200 MG-UNIT per tablet Take 1 tablet by mouth daily with breakfast.     Cholecalciferol (VITAMIN D3 PO) Take 1 tablet by mouth daily.     cloNIDine (CATAPRES) 0.1 MG tablet Take 0.1 mg by mouth 2 (two) times daily.     diltiazem (DILACOR XR) 120 MG 24 hr capsule Take 120 mg by mouth daily.     ELIQUIS 5 MG TABS tablet Take 5 mg by mouth 2 (two) times daily.     Glucosamine HCl (GLUCOSAMINE PO) Glucosamine     ketoconazole (NIZORAL) 2 % cream APPLY DAILY TO RASH AS DIRECTED.     MAGNESIUM PO magnesium     mupirocin ointment (BACTROBAN) 2 % Apply 1 Application topically 3 (three) times daily.     NON FORMULARY Mushroom herbal supplement     Omega-3 Fatty Acids (FISH OIL) 1000 MG CAPS Fish Oil 1,000 mg (120 mg-180 mg) capsule  Take by oral route.     pantoprazole (PROTONIX) 40 MG tablet Take 40 mg by mouth daily.     potassium chloride SA (KLOR-CON M) 20 MEQ tablet Take 1 tablet by mouth 2 (two) times daily.     TART CHERRY PO Tart Cherry     triamcinolone  cream (KENALOG ) 0.1 % Apply topically.     VITAMIN E PO Take 1 capsule by mouth daily.     No current facility-administered medications for this visit.    ASSESSMENT & PLAN:  Assessment:   1.  History of recurrent melanoma twice, originally diagnosed in 57. She has been disease free for many years.    2.  Longstanding lymphedema of the lower extremity as a result of her prior treatment, but this remains stable.  Plan: She informed me of an occasional sharp left upper arm pain and states that this occurs when she is reading and holding her arm up for a extended amount of time but not with day to day activity. I believe she could be developing tendonitis and recommend she position her arm differently or apply heat when the pain occurs. I also recommended  an anti-inflammatory if she can tolerate it. She continues to see dermatology annually. Patient had a chest x-ray done today and the report is pending. We reviewed the images in detail and everything looks good. She had labs done on 03/23/2023 and CBC and CMP were normal. She was found to have a slightly low magnesium of 1.8 so I will add an additional magnesium level to today's labs. She has a WBC of 8.1, hemoglobin of 14.1,  and platelet count of 268,000. Her CMP is normal other than a slightly elevated creatinine of 1.03. Her B-12 and magnesium levels are pending. I will see her back in 6 months with CBC, CMP, and magnesium level. She understands and agrees with this plan of care.  I provided 15 minutes of face-to-face time during this this encounter and > 50% was spent counseling as documented under my assessment and plan.   Nolia Baumgartner, MD [facility] OF [facility] [address] [place] [address] Dept: [phone] Dept Fax: [phone]    No orders of the defined types were placed in this encounter.  I,Jasmine M Lassiter,acting as a scribe for Nolia Baumgartner, MD.,have documented all relevant documentation on the behalf of Nolia Baumgartner, MD,as directed by  Nolia Baumgartner, MD while in the presence of Nolia Baumgartner, MD.

## 2023-04-27 ENCOUNTER — Inpatient Hospital Stay: Payer: Medicare Other | Attending: Oncology | Admitting: Oncology

## 2023-04-27 ENCOUNTER — Ambulatory Visit (HOSPITAL_BASED_OUTPATIENT_CLINIC_OR_DEPARTMENT_OTHER)
Admission: RE | Admit: 2023-04-27 | Discharge: 2023-04-27 | Disposition: A | Discharge status: Home / Self Care | Source: Ambulatory Visit | Attending: Oncology | Admitting: Oncology

## 2023-04-27 ENCOUNTER — Inpatient Hospital Stay: Payer: Medicare Other

## 2023-04-27 ENCOUNTER — Telehealth: Payer: Self-pay | Admitting: Oncology

## 2023-04-27 ENCOUNTER — Other Ambulatory Visit: Payer: Self-pay | Admitting: Oncology

## 2023-04-27 VITALS — BP 126/68 | HR 76 | Temp 97.6°F | Resp 16 | Ht 61.0 in | Wt 199.2 lb

## 2023-04-27 DIAGNOSIS — C774 Secondary and unspecified malignant neoplasm of inguinal and lower limb lymph nodes: Secondary | ICD-10-CM

## 2023-04-27 DIAGNOSIS — I890 Lymphedema, not elsewhere classified: Secondary | ICD-10-CM | POA: Insufficient documentation

## 2023-04-27 DIAGNOSIS — R7989 Other specified abnormal findings of blood chemistry: Secondary | ICD-10-CM | POA: Diagnosis not present

## 2023-04-27 DIAGNOSIS — M47814 Spondylosis without myelopathy or radiculopathy, thoracic region: Secondary | ICD-10-CM | POA: Diagnosis not present

## 2023-04-27 DIAGNOSIS — C4372 Malignant melanoma of left lower limb, including hip: Secondary | ICD-10-CM

## 2023-04-27 DIAGNOSIS — Z85820 Personal history of malignant melanoma of skin: Secondary | ICD-10-CM | POA: Insufficient documentation

## 2023-04-27 DIAGNOSIS — M79622 Pain in left upper arm: Secondary | ICD-10-CM | POA: Insufficient documentation

## 2023-04-27 LAB — VITAMIN B12: Vitamin B-12: 855 pg/mL (ref 180–914)

## 2023-04-27 LAB — CBC WITH DIFFERENTIAL (CANCER CENTER ONLY)
Abs Immature Granulocytes: 0.06 10*3/uL (ref 0.00–0.07)
Basophils Absolute: 0 10*3/uL (ref 0.0–0.1)
Basophils Relative: 0 %
Eosinophils Absolute: 0.2 10*3/uL (ref 0.0–0.5)
Eosinophils Relative: 2 %
HCT: 42.1 % (ref 36.0–46.0)
Hemoglobin: 14.1 g/dL (ref 12.0–15.0)
Immature Granulocytes: 1 %
Lymphocytes Relative: 24 %
Lymphs Abs: 1.9 10*3/uL (ref 0.7–4.0)
MCH: 31.1 pg (ref 26.0–34.0)
MCHC: 33.5 g/dL (ref 30.0–36.0)
MCV: 92.7 fL (ref 80.0–100.0)
Monocytes Absolute: 0.5 10*3/uL (ref 0.1–1.0)
Monocytes Relative: 6 %
Neutro Abs: 5.5 10*3/uL (ref 1.7–7.7)
Neutrophils Relative %: 67 %
Platelet Count: 268 10*3/uL (ref 150–400)
RBC: 4.54 MIL/uL (ref 3.87–5.11)
RDW: 13.7 % (ref 11.5–15.5)
WBC Count: 8.1 10*3/uL (ref 4.0–10.5)
nRBC: 0 % (ref 0.0–0.2)
nRBC: 0 /100{WBCs}

## 2023-04-27 LAB — CMP (CANCER CENTER ONLY)
ALT: 11 U/L (ref 0–44)
AST: 20 U/L (ref 15–41)
Albumin: 4.4 g/dL (ref 3.5–5.0)
Alkaline Phosphatase: 88 U/L (ref 38–126)
Anion gap: 12 (ref 5–15)
BUN: 14 mg/dL (ref 8–23)
CO2: 23 mmol/L (ref 22–32)
Calcium: 9.5 mg/dL (ref 8.9–10.3)
Chloride: 106 mmol/L (ref 98–111)
Creatinine: 1.03 mg/dL — ABNORMAL HIGH (ref 0.44–1.00)
GFR, Estimated: 54 mL/min — ABNORMAL LOW (ref >60)
Glucose, Bld: 100 mg/dL — ABNORMAL HIGH (ref 70–99)
Potassium: 4.4 mmol/L (ref 3.5–5.1)
Sodium: 141 mmol/L (ref 135–145)
Total Bilirubin: 0.4 mg/dL (ref 0.0–1.2)
Total Protein: 6.7 g/dL (ref 6.5–8.1)

## 2023-04-27 LAB — MAGNESIUM: Magnesium: 2 mg/dL (ref 1.7–2.4)

## 2023-04-27 NOTE — Telephone Encounter (Signed)
 Patient has been scheduled for follow-up visit per 04/27/23 LOS.  Pt given an appt calendar with date and time.

## 2023-05-07 ENCOUNTER — Encounter: Payer: Self-pay | Admitting: Oncology

## 2023-05-07 ENCOUNTER — Telehealth: Payer: Self-pay

## 2023-05-07 NOTE — Telephone Encounter (Signed)
 Pt called to request an appt. She has been watching a new area on her left leg for a week. She is nervous as she has had melanoma . She would like your opinion. Does she need to see PCP first? Please advise.

## 2023-05-10 ENCOUNTER — Other Ambulatory Visit: Payer: Self-pay | Admitting: Hematology and Oncology

## 2023-05-10 DIAGNOSIS — C774 Secondary and unspecified malignant neoplasm of inguinal and lower limb lymph nodes: Secondary | ICD-10-CM

## 2023-05-11 ENCOUNTER — Inpatient Hospital Stay: Attending: Oncology | Admitting: Hematology and Oncology

## 2023-05-11 ENCOUNTER — Other Ambulatory Visit: Payer: Self-pay

## 2023-05-11 ENCOUNTER — Ambulatory Visit (HOSPITAL_BASED_OUTPATIENT_CLINIC_OR_DEPARTMENT_OTHER)
Admission: RE | Admit: 2023-05-11 | Discharge: 2023-05-11 | Disposition: A | Discharge status: Home / Self Care | Source: Ambulatory Visit | Attending: Hematology and Oncology | Admitting: Hematology and Oncology

## 2023-05-11 VITALS — BP 152/87 | HR 74 | Resp 20 | Ht 61.0 in | Wt 200.0 lb

## 2023-05-11 DIAGNOSIS — Z7901 Long term (current) use of anticoagulants: Secondary | ICD-10-CM | POA: Diagnosis not present

## 2023-05-11 DIAGNOSIS — R600 Localized edema: Secondary | ICD-10-CM | POA: Diagnosis not present

## 2023-05-11 DIAGNOSIS — I4891 Unspecified atrial fibrillation: Secondary | ICD-10-CM | POA: Diagnosis not present

## 2023-05-11 DIAGNOSIS — I890 Lymphedema, not elsewhere classified: Secondary | ICD-10-CM

## 2023-05-11 DIAGNOSIS — Z800 Family history of malignant neoplasm of digestive organs: Secondary | ICD-10-CM | POA: Diagnosis not present

## 2023-05-11 DIAGNOSIS — Z85820 Personal history of malignant melanoma of skin: Secondary | ICD-10-CM | POA: Insufficient documentation

## 2023-05-11 DIAGNOSIS — Z803 Family history of malignant neoplasm of breast: Secondary | ICD-10-CM | POA: Insufficient documentation

## 2023-05-11 DIAGNOSIS — I513 Intracardiac thrombosis, not elsewhere classified: Secondary | ICD-10-CM | POA: Diagnosis not present

## 2023-05-11 DIAGNOSIS — M7989 Other specified soft tissue disorders: Secondary | ICD-10-CM | POA: Diagnosis not present

## 2023-05-11 NOTE — Progress Notes (Signed)
 [facility]  [address] [place],  [place]  [address] [phone]  Clinic Day:  05/11/2023  Referring physician: Orin Birk, MD  ASSESSMENT & PLAN:   Assessment & Plan: History of recurrent melanoma twice, originally diagnosed in 1992. She has been disease free for many years. Longstanding lymphedema of the lower extremity as a result of her prior treatment. She recently noted an area to her left calf that appears more swollen than the rest of her leg. She also notes overall increased swelling to her left leg today. She denies redness or warmth to the area. She notes that this area comes and goes. She hasn't changed any way in which she cares for her lymphedema. She denies fever or chills. I recommended venous ultrasound of the leg today. Results revealed 1. No evidence of left lower extremity deep vein thrombosis. 2. 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 The vein is patent at the mid thigh, knee and proximal calf. 3. 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 These results were discussed with Dr. Almer Jacobson who recommends anticoagulation for the patient. She is currently on Eliquis for atrial fibrillation. I discussed the findings with the patient and explained that while there is no evidence of DVT, there is chronic thickening of the left great saphenous vein. The anticoagulation therapy will help to reduce risk of a blood clot. I discussed the need to return to clinic or the ED should she notice increasing swelling, redness and/or pain.   She will continue with her Eliquis. She will keep follow up as scheduled.   The patient understands the plans discussed today and is in agreement with them.  She knows to contact our office if she develops concerns prior to her next appointment.   I provided 90 minutes of face-to-face time during this  encounter and > 50% was spent counseling as documented under my assessment and plan.    [name], NP  [facility] [place] - A DEPT OF 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 She denies fevers or chills. She denies pain. Her appetite is good. Her weight has been stable.  REVIEW OF SYSTEMS:  Review of Systems - Oncology   VITALS:  Blood pressure (!) 152/87, pulse 74, resp. rate 20, height 5\' 1"  (1.549 m), weight  200 lb (90.7 kg), SpO2 95%.  Wt Readings from Last 3 Encounters:  05/11/23 200 lb (90.7 kg)  04/27/23 199 lb 3.2 oz (90.4 kg)  10/27/22 196 lb 14.4 oz (89.3 kg)    Body mass index is 37.79 kg/m.  Performance status (ECOG): 1 - Symptomatic but completely ambulatory  PHYSICAL EXAM:  Physical Exam  LABS:      Latest Ref Rng & Units 04/27/2023   10:19 AM 10/27/2022    2:32 PM 09/04/2022   12:00 AM  CBC  WBC 4.0 - 10.5 K/uL 8.1  6.8  7.3      Hemoglobin 12.0 - 15.0 g/dL 29.5  28.4  13.2      Hematocrit  36.0 - 46.0 % 42.1  40.6  40      Platelets 150 - 400 K/uL 268  257  268         This result is from an external source.      Latest Ref Rng & Units 04/27/2023   10:19 AM 10/27/2022    2:32 PM 09/04/2022   12:00 AM  CMP  Glucose 70 - 99 mg/dL 440  97    BUN 8 - 23 mg/dL 14  16  20       Creatinine 0.44 - 1.00 mg/dL 1.02  7.25  1.2      Sodium 135 - 145 mmol/L 141  140  140      Potassium 3.5 - 5.1 mmol/L 4.4  3.9  4.1      Chloride 98 - 111 mmol/L 106  107  106      CO2 22 - 32 mmol/L 23  25  26       Calcium 8.9 - 10.3 mg/dL 9.5  9.3  9.5      Total Protein 6.5 - 8.1 g/dL 6.7  6.5    Total Bilirubin 0.0 - 1.2 mg/dL 0.4  0.7    Alkaline Phos 38 - 126 U/L 88  52  73      AST 15 - 41 U/L 20  18  13       ALT 0 - 44 U/L 11  14  12          This result is from an external source.     No results found for: "CEA1", "CEA" / No results found for: "CEA1", "CEA" No results found for: "PSA1" No results found for: "DGU440" No results found for: "CAN125"  No results found for: "TOTALPROTELP", "ALBUMINELP", "A1GS", "A2GS", "BETS", "BETA2SER", "GAMS", "MSPIKE", "SPEI" No results found for: "TIBC", "FERRITIN", "IRONPCTSAT" No results found for: "LDH"  STUDIES:  US  Venous Img Lower Unilateral Left (DVT) Result Date: 05/11/2023 CLINICAL DATA:  Left leg edema. EXAM: LEFT LOWER EXTREMITY VENOUS DOPPLER ULTRASOUND TECHNIQUE: Gray-scale sonography with graded compression, as well as color Doppler and duplex ultrasound were performed to evaluate the lower extremity deep venous systems from the level of the common femoral vein and including the common femoral, femoral, profunda femoral, popliteal and calf veins including the posterior tibial, peroneal and gastrocnemius veins when visible. The superficial great saphenous vein was also interrogated. Spectral Doppler was utilized to evaluate flow at rest and with distal augmentation maneuvers in the common femoral, femoral and popliteal veins. COMPARISON:   Prior study on 04/21/2017 at [facility] FINDINGS: Contralateral Common Femoral Vein: Respiratory phasicity is normal and symmetric with the symptomatic side. No evidence of thrombus. Normal compressibility. Common Femoral Vein: No evidence of thrombus. Normal compressibility, respiratory phasicity and response to  augmentation. Saphenofemoral Junction: There is some chronic thickening or mural thrombus in the proximal aspect of the left great saphenous vein at the saphenofemoral junction. Profunda Femoral Vein: No evidence of thrombus. Normal compressibility and flow on color Doppler imaging. Femoral Vein: No evidence of thrombus. Normal compressibility, respiratory phasicity and response to augmentation. Popliteal Vein: No evidence of thrombus. Normal compressibility, respiratory phasicity and response to augmentation. Calf Veins: No evidence of thrombus. Normal compressibility and flow on color Doppler imaging. 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 The vein is patent at the mid thigh, knee and proximal calf. Venous Reflux:  None. Other Findings:  No abnormal fluid collections identified. IMPRESSION: 1. No evidence of left lower extremity deep vein thrombosis. 2. 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 The vein is patent at the mid thigh, knee and proximal calf. 3. 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 Electronically Signed   By: Erica Hau M.D.   On: 05/11/2023 13:22   DG Chest 2 View Result Date: 05/04/2023 CLINICAL DATA:  83 year old female with annual chest x-ray EXAM: CHEST - 2 VIEW COMPARISON:  12/01/2022 FINDINGS: Cardiomediastinal silhouette unchanged in size and contour. No evidence of central vascular congestion. No interlobular septal thickening. Unchanged  position of left chest wall pacing device with 2 leads in place No pneumothorax or pleural effusion. Coarsened interstitial markings, with no confluent airspace disease. No acute displaced fracture. Degenerative changes of the spine. IMPRESSION: Negative for acute cardiopulmonary disease Electronically Signed   By: Myrlene Asper D.O.   On: 05/04/2023 14:21      HISTORY:   Past Medical History:  Diagnosis Date   Atrial fibrillation (HCC)    Cancer (HCC)    melanoma of lymph system   Complication of anesthesia    "hard to wake up", reaction to med ('wake up shot")   H/O blood clots 1999   left leg behind knee   Hypertension    Lymphedema of left leg 10/24/2021   Lymphedema of leg    left leg   PONV (postoperative nausea and vomiting)     Past Surgical History:  Procedure Laterality Date   HYSTEROSCOPY WITH D & C N/A 03/27/2014   Procedure: DILATATION AND CURETTAGE /HYSTEROSCOPY, POLYPECTOMY;  Surgeon: Ashby Lawman, MD;  Location: [facility];  Service: Gynecology;  Laterality: N/A;   MELANOMA EXCISION     x 2   TUBAL LIGATION     VULVA /PERINEUM BIOPSY N/A 03/27/2014   Procedure: LEFT VULVAR BIOPSY;  Surgeon: Ashby Lawman, MD;  Location: [facility];  Service: Gynecology;  Laterality: N/A;    Family History  Problem Relation Age of Onset   Liver cancer Mother    Breast cancer Paternal Aunt     Social History:  reports that she has never smoked. She has never used smokeless tobacco. She reports that she does not drink alcohol  and does not use drugs.The patient is alone today.  Allergies:  Allergies  Allergen Reactions   Codeine Nausea And Vomiting and Other (See Comments)   Diltiazem Other (See Comments) and Shortness Of Breath    Very weak   Other Other (See Comments)    hycosmine   Hydrocodone Other (See Comments)    Seeing double   Hydrocortisone    Misc. Sulfonamide Containing Compounds Hives  Family history, prefers not to take   Sulfa Antibiotics Other (See  Comments) and Hives    Family history, prefers not to take   Amoxicillin Rash and Hives   Iohexol Itching and Rash    Current Medications: Current Outpatient Medications  Medication Sig Dispense Refill   amLODipine (NORVASC) 10 MG tablet Take 10 mg by mouth daily.     atorvastatin (LIPITOR) 20 MG tablet Take 20 mg by mouth daily.     benazepril (LOTENSIN) 40 MG tablet Take 1 tablet by mouth daily.     calcium-vitamin D (OSCAL WITH D) 500-200 MG-UNIT per tablet Take 1 tablet by mouth daily with breakfast.     Cholecalciferol (VITAMIN D3 PO) Take 1 tablet by mouth daily.     cloNIDine (CATAPRES) 0.1 MG tablet Take 0.1 mg by mouth 2 (two) times daily.     diltiazem (DILACOR XR) 120 MG 24 hr capsule Take 120 mg by mouth daily.     ELIQUIS 5 MG TABS tablet Take 5 mg by mouth 2 (two) times daily.     flecainide (TAMBOCOR) 100 MG tablet Take 100 mg by mouth.     Glucosamine HCl (GLUCOSAMINE PO) Glucosamine     ketoconazole (NIZORAL) 2 % cream APPLY DAILY TO RASH AS DIRECTED.     MAGNESIUM PO magnesium     mupirocin ointment (BACTROBAN) 2 % Apply 1 Application topically 3 (three) times daily.     NON FORMULARY Mushroom herbal supplement     Omega-3 Fatty Acids (FISH OIL) 1000 MG CAPS Fish Oil 1,000 mg (120 mg-180 mg) capsule  Take by oral route.     pantoprazole (PROTONIX) 40 MG tablet Take 40 mg by mouth daily.     potassium chloride SA (KLOR-CON M) 20 MEQ tablet Take 1 tablet by mouth 2 (two) times daily.     TART CHERRY PO Tart Cherry     triamcinolone  cream (KENALOG ) 0.1 % Apply topically.     VITAMIN E PO Take 1 capsule by mouth daily.     No current facility-administered medications for this visit.

## 2023-05-14 DIAGNOSIS — Z45010 Encounter for checking and testing of cardiac pacemaker pulse generator [battery]: Secondary | ICD-10-CM | POA: Diagnosis not present

## 2023-05-17 DIAGNOSIS — Z45010 Encounter for checking and testing of cardiac pacemaker pulse generator [battery]: Secondary | ICD-10-CM | POA: Diagnosis not present

## 2023-06-01 DIAGNOSIS — I10 Essential (primary) hypertension: Secondary | ICD-10-CM | POA: Diagnosis not present

## 2023-06-01 DIAGNOSIS — I272 Pulmonary hypertension, unspecified: Secondary | ICD-10-CM | POA: Diagnosis not present

## 2023-06-01 DIAGNOSIS — R2242 Localized swelling, mass and lump, left lower limb: Secondary | ICD-10-CM | POA: Diagnosis not present

## 2023-06-01 DIAGNOSIS — Z950 Presence of cardiac pacemaker: Secondary | ICD-10-CM | POA: Diagnosis not present

## 2023-06-01 DIAGNOSIS — I4819 Other persistent atrial fibrillation: Secondary | ICD-10-CM | POA: Diagnosis not present

## 2023-06-01 DIAGNOSIS — I495 Sick sinus syndrome: Secondary | ICD-10-CM | POA: Diagnosis not present

## 2023-06-01 DIAGNOSIS — R0609 Other forms of dyspnea: Secondary | ICD-10-CM | POA: Diagnosis not present

## 2023-06-15 DIAGNOSIS — L820 Inflamed seborrheic keratosis: Secondary | ICD-10-CM | POA: Diagnosis not present

## 2023-06-15 DIAGNOSIS — L821 Other seborrheic keratosis: Secondary | ICD-10-CM | POA: Diagnosis not present

## 2023-06-22 DIAGNOSIS — R2242 Localized swelling, mass and lump, left lower limb: Secondary | ICD-10-CM | POA: Diagnosis not present

## 2023-06-22 DIAGNOSIS — Z950 Presence of cardiac pacemaker: Secondary | ICD-10-CM | POA: Diagnosis not present

## 2023-06-24 DIAGNOSIS — L03116 Cellulitis of left lower limb: Secondary | ICD-10-CM | POA: Diagnosis not present

## 2023-06-27 DIAGNOSIS — L03116 Cellulitis of left lower limb: Secondary | ICD-10-CM | POA: Diagnosis not present

## 2023-07-12 DIAGNOSIS — R2242 Localized swelling, mass and lump, left lower limb: Secondary | ICD-10-CM | POA: Diagnosis not present

## 2023-07-27 DIAGNOSIS — R2242 Localized swelling, mass and lump, left lower limb: Secondary | ICD-10-CM | POA: Diagnosis not present

## 2023-09-09 DIAGNOSIS — Z6838 Body mass index (BMI) 38.0-38.9, adult: Secondary | ICD-10-CM | POA: Diagnosis not present

## 2023-09-09 DIAGNOSIS — I4821 Permanent atrial fibrillation: Secondary | ICD-10-CM | POA: Diagnosis not present

## 2023-09-09 DIAGNOSIS — M1000 Idiopathic gout, unspecified site: Secondary | ICD-10-CM | POA: Diagnosis not present

## 2023-09-09 DIAGNOSIS — I10 Essential (primary) hypertension: Secondary | ICD-10-CM | POA: Diagnosis not present

## 2023-09-09 DIAGNOSIS — E669 Obesity, unspecified: Secondary | ICD-10-CM | POA: Diagnosis not present

## 2023-09-10 LAB — LAB REPORT - SCANNED: EGFR: 52

## 2023-09-22 DIAGNOSIS — L820 Inflamed seborrheic keratosis: Secondary | ICD-10-CM | POA: Diagnosis not present

## 2023-09-22 DIAGNOSIS — Z85828 Personal history of other malignant neoplasm of skin: Secondary | ICD-10-CM | POA: Diagnosis not present

## 2023-09-22 DIAGNOSIS — Z85820 Personal history of malignant melanoma of skin: Secondary | ICD-10-CM | POA: Diagnosis not present

## 2023-09-22 DIAGNOSIS — L821 Other seborrheic keratosis: Secondary | ICD-10-CM | POA: Diagnosis not present

## 2023-09-22 DIAGNOSIS — D485 Neoplasm of uncertain behavior of skin: Secondary | ICD-10-CM | POA: Diagnosis not present

## 2023-09-22 DIAGNOSIS — Z129 Encounter for screening for malignant neoplasm, site unspecified: Secondary | ICD-10-CM | POA: Diagnosis not present

## 2023-09-22 DIAGNOSIS — Z808 Family history of malignant neoplasm of other organs or systems: Secondary | ICD-10-CM | POA: Diagnosis not present

## 2023-10-26 ENCOUNTER — Telehealth: Payer: Self-pay | Admitting: Oncology

## 2023-10-26 ENCOUNTER — Inpatient Hospital Stay: Attending: Oncology | Admitting: Oncology

## 2023-10-26 ENCOUNTER — Other Ambulatory Visit: Payer: Self-pay | Admitting: Oncology

## 2023-10-26 ENCOUNTER — Encounter: Payer: Self-pay | Admitting: Oncology

## 2023-10-26 ENCOUNTER — Inpatient Hospital Stay

## 2023-10-26 VITALS — BP 140/76 | HR 66 | Temp 97.6°F | Resp 16 | Ht 61.0 in | Wt 201.2 lb

## 2023-10-26 DIAGNOSIS — I890 Lymphedema, not elsewhere classified: Secondary | ICD-10-CM | POA: Diagnosis not present

## 2023-10-26 DIAGNOSIS — Z1231 Encounter for screening mammogram for malignant neoplasm of breast: Secondary | ICD-10-CM

## 2023-10-26 DIAGNOSIS — Z86718 Personal history of other venous thrombosis and embolism: Secondary | ICD-10-CM | POA: Diagnosis not present

## 2023-10-26 DIAGNOSIS — C4372 Malignant melanoma of left lower limb, including hip: Secondary | ICD-10-CM

## 2023-10-26 DIAGNOSIS — C774 Secondary and unspecified malignant neoplasm of inguinal and lower limb lymph nodes: Secondary | ICD-10-CM

## 2023-10-26 DIAGNOSIS — Z85820 Personal history of malignant melanoma of skin: Secondary | ICD-10-CM | POA: Insufficient documentation

## 2023-10-26 DIAGNOSIS — Z1239 Encounter for other screening for malignant neoplasm of breast: Secondary | ICD-10-CM

## 2023-10-26 LAB — CBC WITH DIFFERENTIAL (CANCER CENTER ONLY)
Abs Immature Granulocytes: 0.03 K/uL (ref 0.00–0.07)
Basophils Absolute: 0 K/uL (ref 0.0–0.1)
Basophils Relative: 0 %
Eosinophils Absolute: 0.1 K/uL (ref 0.0–0.5)
Eosinophils Relative: 2 %
HCT: 40.4 % (ref 36.0–46.0)
Hemoglobin: 13.3 g/dL (ref 12.0–15.0)
Immature Granulocytes: 1 %
Lymphocytes Relative: 31 %
Lymphs Abs: 1.7 K/uL (ref 0.7–4.0)
MCH: 30.8 pg (ref 26.0–34.0)
MCHC: 32.9 g/dL (ref 30.0–36.0)
MCV: 93.5 fL (ref 80.0–100.0)
Monocytes Absolute: 0.4 K/uL (ref 0.1–1.0)
Monocytes Relative: 7 %
Neutro Abs: 3.2 K/uL (ref 1.7–7.7)
Neutrophils Relative %: 59 %
Platelet Count: 236 K/uL (ref 150–400)
RBC: 4.32 MIL/uL (ref 3.87–5.11)
RDW: 13.9 % (ref 11.5–15.5)
WBC Count: 5.5 K/uL (ref 4.0–10.5)
nRBC: 0 % (ref 0.0–0.2)

## 2023-10-26 LAB — CMP (CANCER CENTER ONLY)
ALT: 12 U/L (ref 0–44)
AST: 18 U/L (ref 15–41)
Albumin: 4.3 g/dL (ref 3.5–5.0)
Alkaline Phosphatase: 75 U/L (ref 38–126)
Anion gap: 9 (ref 5–15)
BUN: 21 mg/dL (ref 8–23)
CO2: 26 mmol/L (ref 22–32)
Calcium: 9.8 mg/dL (ref 8.9–10.3)
Chloride: 106 mmol/L (ref 98–111)
Creatinine: 0.97 mg/dL (ref 0.44–1.00)
GFR, Estimated: 58 mL/min — ABNORMAL LOW (ref >60)
Glucose, Bld: 101 mg/dL — ABNORMAL HIGH (ref 70–99)
Potassium: 4.7 mmol/L (ref 3.5–5.1)
Sodium: 142 mmol/L (ref 135–145)
Total Bilirubin: 0.6 mg/dL (ref 0.0–1.2)
Total Protein: 6.8 g/dL (ref 6.5–8.1)

## 2023-10-26 LAB — MAGNESIUM: Magnesium: 2.1 mg/dL (ref 1.7–2.4)

## 2023-10-26 NOTE — Progress Notes (Signed)
 [facility]  [address] [place],  [place]  [address] [phone]  Clinic Day: 10/26/23  Referring physician: Gayl Males, MD   CHIEF COMPLAINT:  CC: History of stage IIA malignant melanoma with recurrence  Current Treatment:  Surveillance  HISTORY OF PRESENT ILLNESS:  Latasha Wells is a 83 y.o. female with a history of stage IIA malignant melanoma originally diagnosed in 17.  She had a Clark's level II with a Breslow thickness of 7.7 mm, treated with wide excision.  In 1997, she had a recurrence in the left groin and underwent node dissection, followed by 1 year of alpha interferon with Delon Glade, MD at [facility].  In 1998, when she had just 1 week remaining on the interferon therapy, she developed a recurrence in the left groin again.  She then saw Charlie Pizza, MD in [place] and underwent a deep node dissection of the left groin, followed by experimental immune therapy.  She has not had any evidence of recurrence since that time.  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  She sees her dermatologist, Dr. Lorie, in [facility], regularly.  She states she is up-to-date on screening colonoscopy.  Ultrasound of the abdomen limited right upper quadrant from November 2020 was clear other than suggestion of mild hepatic steatosis.  Bone density scan in November 2021 was normal.  INTERVAL HISTORY:  Latasha Wells is here for routine follow up for her history of stage IIA malignant melanoma, which recurred 5 years later. Patient states that she feels well and has no complaints of pain. She informed me that she still has intermittent increased swelling in the lateral left calf. 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 An MRI of the lower extremity was also done on 06/24/2023 which revealed diffuse subcutaneus edema  of the left calf. She was able to show me a picture of her left leg when it was more swollen and it appears to be a localized area of lymphedema. At this time I see no evidence of cellulitis of the left leg but she is at higher risk for recurrent infections due to the lymphedema. She is due for repeat screening bilateral mammogram and I will schedule that for her now. She has a WBC of 5.5, hemoglobin of 13.3, and platelet count of 236,000. Her CMP and magnesium level are normal. She will be due for an repeat bone density scan in September 2026. I will see her back in 6 months with CBC, CMP, and chest x-ray.  She denies fever, chills, night sweats, or other signs of infection. She denies cardiorespiratory and gastrointestinal issues. She  denies pain. Her appetite is very good and Her weight has increased 1 pounds over last 5 months.    REVIEW OF SYSTEMS:  Review of Systems  Constitutional: Negative.  Negative for appetite change, chills, diaphoresis, fatigue, fever and unexpected weight change.  HENT:  Negative.  Negative for hearing loss, lump/mass, mouth sores, nosebleeds, sore throat, tinnitus, trouble swallowing and voice change.   Eyes: Negative.  Negative for eye problems and icterus.  Respiratory: Negative.  Negative for chest tightness, cough, hemoptysis, shortness of breath and wheezing.   Cardiovascular:  Positive for leg swelling (chronic lymphedema  of the left leg). Negative for chest pain and palpitations.  Gastrointestinal: Negative.  Negative for abdominal distention, abdominal pain, blood in stool, constipation, diarrhea, nausea, rectal pain and vomiting.  Endocrine: Negative.   Genitourinary: Negative.  Negative for bladder incontinence, difficulty urinating, dyspareunia, dysuria, frequency, hematuria, menstrual problem, nocturia, pelvic pain,  vaginal bleeding and vaginal discharge.   Musculoskeletal: Negative.  Negative for arthralgias, back pain, flank pain, gait problem, myalgias, neck pain and neck stiffness.       Intermittent increased swelling in the lateral left calf  Skin: Negative.  Negative for itching, rash and wound.  Neurological: Negative.  Negative for dizziness, extremity weakness, gait problem, headaches, light-headedness, numbness, seizures and speech difficulty.  Hematological: Negative.  Negative for adenopathy. Does not bruise/bleed easily.  Psychiatric/Behavioral: Negative.  Negative for confusion, decreased concentration, depression, sleep disturbance and suicidal ideas. The patient is not nervous/anxious.      VITALS:  Blood pressure (!) 140/76, pulse 66, temperature 97.6 F (36.4 C), temperature source Oral, resp. rate 16, height 5' 1 (1.549 m), weight 201 lb 3.2 oz (91.3 kg), SpO2 99%.  Wt Readings from Last 3 Encounters:  10/26/23 201 lb 3.2 oz (91.3 kg)  05/11/23 200 lb (90.7 kg)  04/27/23 199 lb 3.2 oz (90.4 kg)    Body mass index is 38.02 kg/m.  Performance status (ECOG): 1 - Symptomatic but completely ambulatory  PHYSICAL EXAM:  Physical Exam Vitals and nursing note reviewed.  Constitutional:      General: She is not in acute distress.    Appearance: Normal appearance. She is normal weight. She is not ill-appearing, toxic-appearing or diaphoretic.  HENT:     Head: Normocephalic and atraumatic.     Right Ear: Tympanic membrane, ear canal and external ear normal. There is no impacted cerumen.     Left Ear: Tympanic membrane, ear canal and external ear normal. There is no impacted cerumen.     Nose: Nose normal. No congestion or rhinorrhea.     Mouth/Throat:     Mouth: Mucous membranes are moist.     Pharynx: Oropharynx is clear. No oropharyngeal exudate or posterior oropharyngeal erythema.  Eyes:     General: No scleral icterus.       Right eye: No discharge.        Left eye: No  discharge.     Extraocular Movements: Extraocular movements intact.     Conjunctiva/sclera: Conjunctivae normal.     Pupils: Pupils are equal, round, and reactive to light.  Neck:     Vascular: No carotid bruit.  Cardiovascular:     Rate and Rhythm: Normal rate and regular rhythm.     Pulses: Normal pulses.     Heart sounds: Normal heart sounds. No murmur heard.    No friction rub. No gallop.  Pulmonary:     Effort: Pulmonary effort is normal. No respiratory distress.     Breath sounds: Normal breath sounds. No stridor. No wheezing, rhonchi or rales.  Chest:     Chest wall: No tenderness.     Comments: No masses in either breasts.  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  Abdominal:     General: Bowel sounds are normal. There is no distension.     Palpations: Abdomen is soft. There is no mass.     Tenderness: There is no abdominal tenderness. There is no right CVA tenderness, left CVA tenderness, guarding or rebound.     Hernia: No hernia is present.  Comments: Induration in the left groin area   Musculoskeletal:        General: No swelling, tenderness, deformity or signs of injury. Normal range of motion.     Cervical back: Normal range of motion and neck supple. No rigidity or tenderness.     Right lower leg: No edema.     Left lower leg: Edema present.     Comments: Chronic lymphedema of left lower extremity Compression hose in place  Lymphadenopathy:     Cervical: No cervical adenopathy.  Skin:    General: Skin is warm and dry.     Coloration: Skin is not jaundiced or pale.     Findings: No bruising, erythema, lesion or rash.  Neurological:     General: No focal deficit present.     Mental Status: She is alert and oriented to person, place, and time. Mental status is at baseline.     Cranial Nerves: No cranial nerve deficit.     Sensory: No sensory deficit.     Motor: No weakness.     Coordination: Coordination normal.     Gait: Gait normal.     Deep Tendon  Reflexes: Reflexes normal.  Psychiatric:        Mood and Affect: Mood normal.        Behavior: Behavior normal.        Thought Content: Thought content normal.        Judgment: Judgment normal.     LABS:      Latest Ref Rng & Units 10/26/2023    9:49 AM 04/27/2023   10:19 AM 10/27/2022    2:32 PM  CBC  WBC 4.0 - 10.5 K/uL 5.5  8.1  6.8   Hemoglobin 12.0 - 15.0 g/dL 86.6  85.8  86.8   Hematocrit 36.0 - 46.0 % 40.4  42.1  40.6   Platelets 150 - 400 K/uL 236  268  257       Latest Ref Rng & Units 10/26/2023    9:49 AM 04/27/2023   10:19 AM 10/27/2022    2:32 PM  CMP  Glucose 70 - 99 mg/dL 898  899  97   BUN 8 - 23 mg/dL 21  14  16    Creatinine 0.44 - 1.00 mg/dL 9.02  8.96  9.02   Sodium 135 - 145 mmol/L 142  141  140   Potassium 3.5 - 5.1 mmol/L 4.7  4.4  3.9   Chloride 98 - 111 mmol/L 106  106  107   CO2 22 - 32 mmol/L 26  23  25    Calcium 8.9 - 10.3 mg/dL 9.8  9.5  9.3   Total Protein 6.5 - 8.1 g/dL 6.8  6.7  6.5   Total Bilirubin 0.0 - 1.2 mg/dL 0.6  0.4  0.7   Alkaline Phos 38 - 126 U/L 75  88  52   AST 15 - 41 U/L 18  20  18    ALT 0 - 44 U/L 12  11  14     STUDIES:        Allergies:  Allergies  Allergen Reactions   Codeine Nausea And Vomiting and Other (See Comments)   Diltiazem Other (See Comments) and Shortness Of Breath    Very weak   Other Other (See Comments)    hycosmine   Hydrocodone Other (See Comments)    Seeing double   Hydrocortisone    Misc. Sulfonamide Containing Compounds Hives    Family history, prefers not  to take   Sulfa Antibiotics Other (See Comments) and Hives    Family history, prefers not to take   Amoxicillin Rash and Hives   Iohexol Itching and Rash    Current Medications: Current Outpatient Medications  Medication Sig Dispense Refill   amLODipine (NORVASC) 10 MG tablet Take 10 mg by mouth daily.     atorvastatin (LIPITOR) 20 MG tablet Take 20 mg by mouth daily.     benazepril (LOTENSIN) 40 MG tablet Take 1 tablet by  mouth daily.     calcium-vitamin D (OSCAL WITH D) 500-200 MG-UNIT per tablet Take 1 tablet by mouth daily with breakfast.     Cholecalciferol (VITAMIN D3 PO) Take 1 tablet by mouth daily.     cloNIDine (CATAPRES) 0.1 MG tablet Take 0.1 mg by mouth 2 (two) times daily.     diltiazem (DILACOR XR) 120 MG 24 hr capsule Take 120 mg by mouth daily.     ELIQUIS 5 MG TABS tablet Take 5 mg by mouth 2 (two) times daily.     flecainide (TAMBOCOR) 100 MG tablet Take 100 mg by mouth.     Glucosamine HCl (GLUCOSAMINE PO) Glucosamine     ketoconazole (NIZORAL) 2 % cream APPLY DAILY TO RASH AS DIRECTED.     MAGNESIUM PO magnesium     mupirocin ointment (BACTROBAN) 2 % Apply 1 Application topically 3 (three) times daily.     NON FORMULARY Mushroom herbal supplement     Omega-3 Fatty Acids (FISH OIL) 1000 MG CAPS Fish Oil 1,000 mg (120 mg-180 mg) capsule  Take by oral route.     pantoprazole (PROTONIX) 40 MG tablet Take 40 mg by mouth daily.     potassium chloride SA (KLOR-CON M) 20 MEQ tablet Take 1 tablet by mouth 2 (two) times daily.     TART CHERRY PO Tart Cherry     triamcinolone  cream (KENALOG ) 0.1 % Apply topically.     VITAMIN E PO Take 1 capsule by mouth daily.     No current facility-administered medications for this visit.    ASSESSMENT & PLAN:  Assessment:   1.  History of recurrent melanoma twice, originally diagnosed in 40. She has been disease free for many years.    2.  Longstanding lymphedema of the lower extremity as a result of her prior treatment, but this remains stable.  Plan: She informed me that she still has intermittent increased swelling in the lateral left calf. 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 An MRI of the  lower extremity was also done on 06/24/2023 which revealed diffuse subcutaneus edema  of the left calf. She was able to show me a picture of her left leg when it was more swollen and it appears to be a localized area of lymphedema. At this time I see no evidence of cellulitis of the left leg but she is at higher risk for recurrent infections due to the lymphedema. She is due for repeat screening bilateral mammogram and I will schedule that for her now. She has a WBC of 5.5, hemoglobin of 13.3, and platelet count of 236,000. Her CMP and magnesium level are normal. She will be due for an repeat bone density scan in September 2026. I will see  her back in 6 months with CBC, CMP, and chest x-ray. She understands and agrees with this plan of care.  I provided 17 minutes of face-to-face time during this this encounter and > 50% was spent counseling as documented under my assessment and plan.   Wanda VEAR Cornish, MD [facility] - A DEPT OF [facility] [address] [place] [address] Dept: [phone] Dept Fax: [phone]    No orders of the defined types were placed in this encounter.  I,Jasmine M Lassiter,acting as a scribe for Wanda VEAR Cornish, MD.,have documented all relevant documentation on the behalf of Wanda VEAR Cornish, MD,as directed by  Wanda VEAR Cornish, MD while in the presence of Wanda VEAR Cornish, MD.

## 2023-10-26 NOTE — Telephone Encounter (Signed)
 Patient has been scheduled for follow-up visit per 10/26/23 LOS.  Pt given an appt calendar with date and time.

## 2023-11-02 DIAGNOSIS — Z0000 Encounter for general adult medical examination without abnormal findings: Secondary | ICD-10-CM | POA: Diagnosis not present

## 2023-11-02 DIAGNOSIS — I82509 Chronic embolism and thrombosis of unspecified deep veins of unspecified lower extremity: Secondary | ICD-10-CM | POA: Diagnosis not present

## 2023-11-02 DIAGNOSIS — I10 Essential (primary) hypertension: Secondary | ICD-10-CM | POA: Diagnosis not present

## 2023-11-02 DIAGNOSIS — Z1389 Encounter for screening for other disorder: Secondary | ICD-10-CM | POA: Diagnosis not present

## 2023-11-02 DIAGNOSIS — Z2821 Immunization not carried out because of patient refusal: Secondary | ICD-10-CM | POA: Diagnosis not present

## 2023-11-02 DIAGNOSIS — N1831 Chronic kidney disease, stage 3a: Secondary | ICD-10-CM | POA: Diagnosis not present

## 2023-11-05 ENCOUNTER — Encounter (HOSPITAL_BASED_OUTPATIENT_CLINIC_OR_DEPARTMENT_OTHER): Payer: Self-pay | Admitting: Radiology

## 2023-11-05 ENCOUNTER — Ambulatory Visit (HOSPITAL_BASED_OUTPATIENT_CLINIC_OR_DEPARTMENT_OTHER)
Admission: RE | Admit: 2023-11-05 | Discharge: 2023-11-05 | Disposition: A | Discharge status: Home / Self Care | Source: Ambulatory Visit | Attending: Oncology | Admitting: Oncology

## 2023-11-05 DIAGNOSIS — Z1231 Encounter for screening mammogram for malignant neoplasm of breast: Secondary | ICD-10-CM | POA: Diagnosis not present

## 2023-11-05 DIAGNOSIS — Z1239 Encounter for other screening for malignant neoplasm of breast: Secondary | ICD-10-CM

## 2023-11-17 DIAGNOSIS — Z950 Presence of cardiac pacemaker: Secondary | ICD-10-CM | POA: Diagnosis not present

## 2024-04-27 ENCOUNTER — Inpatient Hospital Stay: Admitting: Oncology

## 2024-04-27 ENCOUNTER — Inpatient Hospital Stay
